# Patient Record
Sex: Female | Born: 1985 | Race: Black or African American | Hispanic: No | Marital: Single | Smoking: Current every day smoker
Health system: Southern US, Community
[De-identification: ages and names within clinical notes are randomized; demographics above are authoritative.]

## PROBLEM LIST (undated history)

## (undated) ENCOUNTER — Emergency Department

## (undated) DIAGNOSIS — O99330 Smoking (tobacco) complicating pregnancy, unspecified trimester: Secondary | ICD-10-CM

## (undated) DIAGNOSIS — O99320 Drug use complicating pregnancy, unspecified trimester: Secondary | ICD-10-CM

## (undated) DIAGNOSIS — O09529 Supervision of elderly multigravida, unspecified trimester: Secondary | ICD-10-CM

## (undated) DIAGNOSIS — F1910 Other psychoactive substance abuse, uncomplicated: Secondary | ICD-10-CM

---
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
## 2004-07-15 ENCOUNTER — Emergency Department: Payer: Self-pay | Admitting: Internal Medicine

## 2004-10-13 ENCOUNTER — Emergency Department: Payer: Self-pay | Admitting: Emergency Medicine

## 2005-08-27 ENCOUNTER — Emergency Department: Payer: Self-pay | Admitting: Emergency Medicine

## 2005-10-23 ENCOUNTER — Observation Stay: Payer: Self-pay | Admitting: Obstetrics and Gynecology

## 2005-12-21 ENCOUNTER — Ambulatory Visit: Payer: Self-pay | Admitting: Family Medicine

## 2006-02-13 ENCOUNTER — Observation Stay: Payer: Self-pay | Admitting: Obstetrics and Gynecology

## 2006-02-21 ENCOUNTER — Inpatient Hospital Stay: Payer: Self-pay

## 2006-05-15 ENCOUNTER — Emergency Department: Payer: Self-pay | Admitting: Emergency Medicine

## 2006-09-05 ENCOUNTER — Emergency Department: Payer: Self-pay | Admitting: General Practice

## 2008-01-07 ENCOUNTER — Emergency Department: Payer: Self-pay | Admitting: Emergency Medicine

## 2008-03-02 ENCOUNTER — Emergency Department: Payer: Self-pay | Admitting: Emergency Medicine

## 2008-03-04 ENCOUNTER — Emergency Department: Payer: Self-pay | Admitting: Emergency Medicine

## 2008-05-15 ENCOUNTER — Observation Stay: Payer: Self-pay | Admitting: Unknown Physician Specialty

## 2008-07-19 ENCOUNTER — Ambulatory Visit: Payer: Self-pay | Admitting: Obstetrics & Gynecology

## 2008-07-19 ENCOUNTER — Inpatient Hospital Stay (HOSPITAL_COMMUNITY): Admission: AD | Admit: 2008-07-19 | Discharge: 2008-07-19 | Payer: Self-pay | Admitting: Obstetrics & Gynecology

## 2009-04-19 ENCOUNTER — Emergency Department: Payer: Self-pay | Admitting: Emergency Medicine

## 2009-06-09 ENCOUNTER — Emergency Department: Payer: Self-pay | Admitting: Internal Medicine

## 2010-03-05 ENCOUNTER — Inpatient Hospital Stay (HOSPITAL_COMMUNITY): Admission: AD | Admit: 2010-03-05 | Discharge: 2010-03-05 | Payer: Self-pay | Admitting: Obstetrics & Gynecology

## 2010-03-05 ENCOUNTER — Ambulatory Visit: Payer: Self-pay | Admitting: Gynecology

## 2010-03-08 ENCOUNTER — Emergency Department (HOSPITAL_COMMUNITY): Admission: EM | Admit: 2010-03-08 | Discharge: 2010-03-08 | Payer: Self-pay | Admitting: Family Medicine

## 2010-08-18 ENCOUNTER — Inpatient Hospital Stay: Payer: Self-pay | Admitting: Obstetrics and Gynecology

## 2010-12-13 LAB — URINALYSIS, ROUTINE W REFLEX MICROSCOPIC
Bilirubin Urine: NEGATIVE
Hgb urine dipstick: NEGATIVE
Ketones, ur: NEGATIVE mg/dL
Nitrite: NEGATIVE
Protein, ur: NEGATIVE mg/dL
Specific Gravity, Urine: 1.02 (ref 1.005–1.030)
Urobilinogen, UA: 0.2 mg/dL (ref 0.0–1.0)
pH: 6.5 (ref 5.0–8.0)

## 2010-12-13 LAB — WET PREP, GENITAL: Trich, Wet Prep: NONE SEEN

## 2010-12-13 LAB — GC/CHLAMYDIA PROBE AMP, GENITAL
Chlamydia, DNA Probe: NEGATIVE
GC Probe Amp, Genital: NEGATIVE

## 2010-12-13 LAB — POCT PREGNANCY, URINE: Preg Test, Ur: POSITIVE

## 2010-12-13 LAB — CBC
HCT: 34.6 % — ABNORMAL LOW (ref 36.0–46.0)
MCHC: 34.4 g/dL (ref 30.0–36.0)
RBC: 3.58 MIL/uL — ABNORMAL LOW (ref 3.87–5.11)

## 2010-12-13 LAB — HCG, QUANTITATIVE, PREGNANCY: hCG, Beta Chain, Quant, S: 58892 m[IU]/mL — ABNORMAL HIGH (ref <5)

## 2011-03-15 ENCOUNTER — Emergency Department: Payer: Self-pay | Admitting: Emergency Medicine

## 2011-04-23 ENCOUNTER — Emergency Department: Payer: Self-pay | Admitting: Unknown Physician Specialty

## 2011-06-27 LAB — URINE MICROSCOPIC-ADD ON

## 2011-06-27 LAB — URINALYSIS, ROUTINE W REFLEX MICROSCOPIC
Bilirubin Urine: NEGATIVE
Hgb urine dipstick: NEGATIVE
Ketones, ur: NEGATIVE
Protein, ur: NEGATIVE
Urobilinogen, UA: 0.2
pH: 7.5

## 2011-06-27 LAB — RAPID URINE DRUG SCREEN, HOSP PERFORMED: Tetrahydrocannabinol: POSITIVE — AB

## 2011-08-11 ENCOUNTER — Encounter: Payer: Self-pay | Admitting: Obstetrics and Gynecology

## 2011-08-25 ENCOUNTER — Observation Stay: Payer: Self-pay

## 2011-11-23 ENCOUNTER — Inpatient Hospital Stay: Payer: Self-pay | Admitting: Obstetrics and Gynecology

## 2011-11-23 LAB — CBC WITH DIFFERENTIAL/PLATELET
Basophil %: 0.4 %
Eosinophil %: 0.5 %
HCT: 35.7 % (ref 35.0–47.0)
HGB: 12.1 g/dL (ref 12.0–16.0)
Lymphocyte #: 3 10*3/uL (ref 1.0–3.6)
Lymphocyte %: 28.9 %
MCV: 95 fL (ref 80–100)
Monocyte %: 13.9 %
Neutrophil #: 5.7 10*3/uL (ref 1.4–6.5)
RDW: 11.8 % (ref 11.5–14.5)
WBC: 10.2 10*3/uL (ref 3.6–11.0)

## 2011-11-23 LAB — DRUG SCREEN, URINE
Barbiturates, Ur Screen: NEGATIVE (ref ?–200)
Benzodiazepine, Ur Scrn: NEGATIVE (ref ?–200)
Cocaine Metabolite,Ur ~~LOC~~: POSITIVE (ref ?–300)
Methadone, Ur Screen: NEGATIVE (ref ?–300)
Tricyclic, Ur Screen: NEGATIVE (ref ?–1000)

## 2011-11-24 LAB — HEMATOCRIT: HCT: 30.9 % — ABNORMAL LOW (ref 35.0–47.0)

## 2012-07-11 ENCOUNTER — Emergency Department: Payer: Self-pay | Admitting: Emergency Medicine

## 2012-07-11 LAB — CBC
HCT: 40.8 % (ref 35.0–47.0)
MCH: 31.8 pg (ref 26.0–34.0)
MCHC: 34.5 g/dL (ref 32.0–36.0)
MCV: 92 fL (ref 80–100)

## 2012-07-11 LAB — WET PREP, GENITAL

## 2012-07-11 LAB — HCG, QUANTITATIVE, PREGNANCY: Beta Hcg, Quant.: 26 m[IU]/mL — ABNORMAL HIGH

## 2013-01-26 ENCOUNTER — Emergency Department: Payer: Self-pay | Admitting: Unknown Physician Specialty

## 2013-01-26 LAB — URINALYSIS, COMPLETE
Bacteria: NONE SEEN
Bilirubin,UR: NEGATIVE
Blood: NEGATIVE
Glucose,UR: NEGATIVE mg/dL (ref 0–75)
Ph: 5 (ref 4.5–8.0)
Protein: NEGATIVE
RBC,UR: 4 /HPF (ref 0–5)
WBC UR: 12 /HPF (ref 0–5)

## 2013-01-26 LAB — COMPREHENSIVE METABOLIC PANEL
Albumin: 4.2 g/dL (ref 3.4–5.0)
Anion Gap: 7 (ref 7–16)
BUN: 11 mg/dL (ref 7–18)
Bilirubin,Total: 1.6 mg/dL — ABNORMAL HIGH (ref 0.2–1.0)
Calcium, Total: 9.2 mg/dL (ref 8.5–10.1)
Chloride: 101 mmol/L (ref 98–107)
EGFR (Non-African Amer.): >60
Osmolality: 268 (ref 275–301)
Total Protein: 8.2 g/dL (ref 6.4–8.2)

## 2013-01-26 LAB — DRUG SCREEN, URINE
Barbiturates, Ur Screen: NEGATIVE (ref ?–200)
Benzodiazepine, Ur Scrn: NEGATIVE (ref ?–200)
Cocaine Metabolite,Ur ~~LOC~~: POSITIVE (ref ?–300)
MDMA (Ecstasy)Ur Screen: NEGATIVE (ref ?–500)
Methadone, Ur Screen: NEGATIVE (ref ?–300)
Opiate, Ur Screen: NEGATIVE (ref ?–300)
Phencyclidine (PCP) Ur S: NEGATIVE (ref ?–25)
Tricyclic, Ur Screen: NEGATIVE (ref ?–1000)

## 2013-01-26 LAB — CBC
HCT: 45.2 % (ref 35.0–47.0)
MCH: 31.7 pg (ref 26.0–34.0)
RBC: 4.93 10*6/uL (ref 3.80–5.20)
RDW: 13.7 % (ref 11.5–14.5)

## 2013-01-26 LAB — HCG, QUANTITATIVE, PREGNANCY: Beta Hcg, Quant.: <1 m[IU]/mL — ABNORMAL LOW

## 2013-05-08 ENCOUNTER — Emergency Department: Payer: Self-pay | Admitting: Emergency Medicine

## 2013-05-08 LAB — COMPREHENSIVE METABOLIC PANEL
Alkaline Phosphatase: 90 U/L (ref 50–136)
Anion Gap: 7 (ref 7–16)
Calcium, Total: 9.1 mg/dL (ref 8.5–10.1)
Co2: 27 mmol/L (ref 21–32)
EGFR (African American): >60
Glucose: 80 mg/dL (ref 65–99)
Potassium: 3.8 mmol/L (ref 3.5–5.1)
SGOT(AST): 16 U/L (ref 15–37)
Sodium: 136 mmol/L (ref 136–145)

## 2013-05-08 LAB — URINALYSIS, COMPLETE
Bilirubin,UR: NEGATIVE
Ph: 5 (ref 4.5–8.0)
Specific Gravity: 1.01 (ref 1.003–1.030)

## 2013-05-08 LAB — LIPASE, BLOOD: Lipase: 101 U/L (ref 73–393)

## 2013-05-08 LAB — CBC
HCT: 41.2 % (ref 35.0–47.0)
MCHC: 34.7 g/dL (ref 32.0–36.0)
Platelet: 255 10*3/uL (ref 150–440)
RBC: 4.34 10*6/uL (ref 3.80–5.20)
RDW: 12.9 % (ref 11.5–14.5)

## 2013-05-08 LAB — PREGNANCY, URINE: Pregnancy Test, Urine: NEGATIVE m[IU]/mL

## 2014-05-21 ENCOUNTER — Emergency Department: Payer: Self-pay | Admitting: Emergency Medicine

## 2014-05-21 LAB — COMPREHENSIVE METABOLIC PANEL
ALT: 15 U/L
AST: 17 U/L (ref 15–37)
Albumin: 4.1 g/dL (ref 3.4–5.0)
Alkaline Phosphatase: 71 U/L
Anion Gap: 11 (ref 7–16)
BUN: 8 mg/dL (ref 7–18)
Bilirubin,Total: 2.1 mg/dL — ABNORMAL HIGH (ref 0.2–1.0)
CALCIUM: 8.9 mg/dL (ref 8.5–10.1)
CO2: 22 mmol/L (ref 21–32)
CREATININE: 0.79 mg/dL (ref 0.60–1.30)
Chloride: 105 mmol/L (ref 98–107)
GLUCOSE: 82 mg/dL (ref 65–99)
Osmolality: 273 (ref 275–301)
POTASSIUM: 3.5 mmol/L (ref 3.5–5.1)
SODIUM: 138 mmol/L (ref 136–145)
TOTAL PROTEIN: 7.9 g/dL (ref 6.4–8.2)

## 2014-05-21 LAB — URINALYSIS, COMPLETE
BACTERIA: NONE SEEN
BILIRUBIN, UR: NEGATIVE
BLOOD: NEGATIVE
GLUCOSE, UR: NEGATIVE mg/dL (ref 0–75)
NITRITE: NEGATIVE
Ph: 8 (ref 4.5–8.0)
Protein: 30
Specific Gravity: 1.024 (ref 1.003–1.030)
Squamous Epithelial: 11
WBC UR: 37 /HPF (ref 0–5)

## 2014-05-21 LAB — CBC WITH DIFFERENTIAL/PLATELET
BASOS ABS: 0.1 10*3/uL (ref 0.0–0.1)
BASOS PCT: 0.3 %
EOS ABS: 0 10*3/uL (ref 0.0–0.7)
Eosinophil %: 0.1 %
HCT: 42.9 % (ref 35.0–47.0)
HGB: 14.7 g/dL (ref 12.0–16.0)
LYMPHS ABS: 1.7 10*3/uL (ref 1.0–3.6)
Lymphocyte %: 10.7 %
MCH: 32.2 pg (ref 26.0–34.0)
MCHC: 34.3 g/dL (ref 32.0–36.0)
MCV: 94 fL (ref 80–100)
MONOS PCT: 6.8 %
Monocyte #: 1.1 x10 3/mm — ABNORMAL HIGH (ref 0.2–0.9)
NEUTROS PCT: 82.1 %
Neutrophil #: 12.8 10*3/uL — ABNORMAL HIGH (ref 1.4–6.5)
Platelet: 286 10*3/uL (ref 150–440)
RBC: 4.57 10*6/uL (ref 3.80–5.20)
RDW: 13.1 % (ref 11.5–14.5)
WBC: 15.6 10*3/uL — ABNORMAL HIGH (ref 3.6–11.0)

## 2014-05-21 LAB — PREGNANCY, URINE: Pregnancy Test, Urine: NEGATIVE m[IU]/mL

## 2014-05-21 LAB — WET PREP, GENITAL

## 2014-05-21 LAB — HCG, QUANTITATIVE, PREGNANCY

## 2014-05-21 LAB — TROPONIN I

## 2014-05-21 LAB — LIPASE, BLOOD: LIPASE: 139 U/L (ref 73–393)

## 2014-05-22 LAB — GC/CHLAMYDIA PROBE AMP

## 2014-09-10 ENCOUNTER — Observation Stay: Payer: Self-pay

## 2014-09-10 LAB — URINALYSIS, COMPLETE
BACTERIA: NONE SEEN
BLOOD: NEGATIVE
Bilirubin,UR: NEGATIVE
Glucose,UR: NEGATIVE mg/dL (ref 0–75)
KETONE: NEGATIVE
Leukocyte Esterase: NEGATIVE
Nitrite: NEGATIVE
PH: 7 (ref 4.5–8.0)
Protein: NEGATIVE
SPECIFIC GRAVITY: 1.016 (ref 1.003–1.030)
Squamous Epithelial: 2

## 2014-09-10 LAB — DRUG SCREEN, URINE
AMPHETAMINES, UR SCREEN: NEGATIVE (ref ?–1000)
BENZODIAZEPINE, UR SCRN: NEGATIVE (ref ?–200)
Barbiturates, Ur Screen: NEGATIVE (ref ?–200)
COCAINE METABOLITE, UR ~~LOC~~: POSITIVE (ref ?–300)
Cannabinoid 50 Ng, Ur ~~LOC~~: POSITIVE (ref ?–50)
MDMA (ECSTASY) UR SCREEN: NEGATIVE (ref ?–500)
METHADONE, UR SCREEN: NEGATIVE (ref ?–300)
Opiate, Ur Screen: NEGATIVE (ref ?–300)
Phencyclidine (PCP) Ur S: NEGATIVE (ref ?–25)
TRICYCLIC, UR SCREEN: NEGATIVE (ref ?–1000)

## 2014-09-10 LAB — GC/CHLAMYDIA PROBE AMP

## 2014-09-10 LAB — PREGNANCY, URINE: PREGNANCY TEST, URINE: POSITIVE m[IU]/mL

## 2014-09-26 NOTE — L&D Delivery Note (Signed)
Deliver Note   Date of Delivery:   04/09/2015 Primary OB:   [facility] Gestational Age/EDD: 3959w1d  Antepartum complications:  OB History    Gravida Para Term Preterm AB TAB SAB Ectopic Multiple Living   6               Delivered By:   Vena AustriaStaebler, Gottlieb Zuercher MD  Delivery Type:   TSVD Anesthesia:     Epidural  Intrapartum complications:  GBS:    Unknown Laceration:    None Episiotomy:    none Placenta:    Spontaneous Estimated Blood Loss:  250mL Baby:    Liveborn female , APGAR (1 MIN):  9 APGAR (5 MINS):  9 APGAR (10 MINS):  , weight   pending   Deliver Details   A liveborn female was delivered via  NVD (Presentation: OA ).  APGAR:9 ,9 weight pending Placenta status:intact, 3VC  Cord:  withoutcomplications: Marland Kitchen.    Mom to postpartum.  Baby to Couplet care / Skin to Skin.

## 2015-01-28 ENCOUNTER — Observation Stay
Admission: RE | Admit: 2015-01-28 | Discharge: 2015-01-29 | Disposition: A | Discharge status: Home / Self Care | Payer: Medicaid Other | Attending: Obstetrics and Gynecology | Admitting: Obstetrics and Gynecology

## 2015-01-28 ENCOUNTER — Encounter: Payer: Self-pay | Admitting: *Deleted

## 2015-01-28 DIAGNOSIS — O479 False labor, unspecified: Secondary | ICD-10-CM | POA: Diagnosis present

## 2015-01-28 DIAGNOSIS — O4700 False labor before 37 completed weeks of gestation, unspecified trimester: Secondary | ICD-10-CM | POA: Diagnosis present

## 2015-01-28 DIAGNOSIS — Z3A29 29 weeks gestation of pregnancy: Secondary | ICD-10-CM | POA: Insufficient documentation

## 2015-01-28 DIAGNOSIS — O4703 False labor before 37 completed weeks of gestation, third trimester: Secondary | ICD-10-CM

## 2015-01-28 DIAGNOSIS — O99333 Smoking (tobacco) complicating pregnancy, third trimester: Secondary | ICD-10-CM | POA: Insufficient documentation

## 2015-01-28 LAB — URINALYSIS COMPLETE WITH MICROSCOPIC (ARMC ONLY)
Bilirubin Urine: NEGATIVE
GLUCOSE, UA: NEGATIVE mg/dL
KETONES UR: NEGATIVE mg/dL
LEUKOCYTES UA: NEGATIVE
Nitrite: NEGATIVE
Protein, ur: NEGATIVE mg/dL
RBC / HPF: NONE SEEN RBC/hpf (ref 0–5)
Specific Gravity, Urine: 1 — ABNORMAL LOW (ref 1.005–1.030)
pH: 7 (ref 5.0–8.0)

## 2015-01-28 MED ORDER — ACETAMINOPHEN 325 MG PO TABS: 650.0000 mg | ORAL_TABLET | ORAL | Status: DC | PRN

## 2015-01-28 NOTE — OB Triage Note (Signed)
Patient presents to L&D via EMS c/o abd pain that has lasted all day.  Patient c/o pressure and needs to go to the bathroom assisted.  Denies bleeding or SROM.  Pt also denies alchohol or drug abuse.  She continues to use tobacco and smokes 3 - 4 cigs per day.  [facility] d/t previous drug and alcohol abuse.

## 2015-01-28 NOTE — H&P (Signed)
Obstetric History and Physical  Paige Pierce is a 29 y.o. G 8 P5025 with EDD of 04/10/15 [facility] wk [facility]. Pt presents at 4466w4d with c/o contractions Patient states she has been having  Contractions every 10 minutes prior to arrival. , none vaginal bleeding, intact membranes, with active fetal movement.    Prenatal Course Source of Care: [facility] with onset of care at 17 weeks Pregnancy complications or risks: Patient Active Problem List   Diagnosis Date Noted  . Premature uterine contractions 01/28/2015  . Preterm contractions 01/28/2015    Prenatal labs and studies: ABO, Rh: AB + Antibody: Anti-A1 Rubella: Non-immune Varicella:  RPR:   HBsAg:   HIV:  GC/CT:  GBS: 1 hr Glucola   Genetic screening:   Prenatal Transfer Tool   PMHx: none    PSHx: none    OB Hx: SVD x 5  History   Social History  . Marital Status: Single    Spouse Name: N/A  . Number of Children: N/A  . Years of Education: N/A   Social History Main Topics  . Smoking status: 3-4 cigarettes/day  . Smokeless tobacco: Not on file  . Alcohol Use: D/c in pregnancy  . Drug Use: Reports d/c cocaine and marijuana use in pregnancy (previously + at visit on 09/10/14)   . Sexual Activity: Not on file                No family history on file.  Prescriptions prior to admission  Medication Sig Dispense Refill Last Dose  . DOCOSAHEXAENOIC ACID PO Take by mouth.     . promethazine (PHENERGAN) 25 MG tablet Take 25 mg by mouth every 6 (six) hours as needed for nausea or vomiting.       No Known Allergies  Review of Systems: Negative except for what is mentioned in HPI.  Physical Exam: BP 130/84 mmHg  Pulse 78  Resp 20  Ht 5\' 4"  (1.626 m)  Wt 62.143 kg (137 lb)  BMI 23.50 kg/m2 GENERAL: Well-developed, well-nourished female in no acute distress.  LUNGS: Clear to auscultation bilaterally.  HEART: Regular rate and rhythm. ABDOMEN: Soft, nontender, nondistended, gravid. EXTREMITIES: Nontender, no  edema Cervical Exam: Dilatation 0 cm   Effacement 0%   Station OOP   Presentation: cephalic FHT: Category: Baseline rate 140 bpm   Variability moderate  Accelerations present   Decelerations none Contractions: absent   Pertinent Labs/Studies:   No results found for this or any previous visit (from the past 24 hour(s)).  Assessment : IUP at 6266w4d with c/o contractions, no evidence of Preterm labor  Plan: UA and UDS Will plan for discharge home once UA returned.  Pt plans to transfer care to [facility].

## 2015-01-29 LAB — URINE DRUG SCREEN, QUALITATIVE (ARMC ONLY)
Amphetamines, Ur Screen: NOT DETECTED
Barbiturates, Ur Screen: NOT DETECTED
Benzodiazepine, Ur Scrn: NOT DETECTED
CANNABINOID 50 NG, UR ~~LOC~~: POSITIVE — AB
Cocaine Metabolite,Ur ~~LOC~~: NOT DETECTED
MDMA (ECSTASY) UR SCREEN: NOT DETECTED
Methadone Scn, Ur: NOT DETECTED
Opiate, Ur Screen: NOT DETECTED
Phencyclidine (PCP) Ur S: NOT DETECTED
Tricyclic, Ur Screen: NOT DETECTED

## 2015-01-29 NOTE — OB Triage Note (Signed)
D/c instructions given,will call with ua results.

## 2015-01-29 NOTE — Progress Notes (Signed)
Initial UDS order cancelled automatically.  New order placed as required and instructed by lab.

## 2015-02-03 NOTE — H&P (Signed)
L&D Evaluation:  History:   HPI 29 year old [id number] presents to L&D by EMS with c/o contractions/onset of labor. Pt had PNC that started at [facility] (unknown gestation at that time) then pt was incarcerated in December for violation of her probation and [facility]. Pt unsure of LMP but had U/S by [facility] on 08/11/11 that estimated pt was 23 weeks and gave EDD 12/07/11.  38 weeks today per 23 week U/S.  Pt was brought in by EMS who picked her up at a motel in the area (pt states she was visiting the [facility]). Pregnancy so far notable for drug use (cocaine and MJ) which she states she last used 2 weeks ago, smoker, incarceration during pregnancy, and depression which she uses Trazadone. Pt is learning disabled and has 1st to 2nd grade level of education. History of 4 SVD's in 05, 07, 09, and 2011 with no complications (had No PNC). The pt has no custody of any of her children. Labs: AB Pos, RI, VI, HIV Neg, Hep B Neg, GC/Chlam Negative 07/21/11 GBS unknown    Presents with contractions    Patient's Medical History depression, learning disabled    Patient's Surgical History none    Medications Pre Natal Vitamins  Tylenol (Acetaminophen)  Trazadone    Allergies NKDA    Social History tobacco  EtOH  drugs    Family History Non-Contributory   ROS:   ROS All systems were reviewed.  HEENT, CNS, GI, GU, Respiratory, CV, Renal and Musculoskeletal systems were found to be normal.   Exam:   Vital Signs stable    Urine Protein not completed    General no apparent distress, painful ctx's    Mental Status clear    Chest clear    Abdomen gravid, tender with contractions    Estimated Fetal Weight Small for gestational age    Back no CVAT    Edema no edema    Pelvic no external lesions, 5 cm on arrival, 7 cm now    Mebranes Intact    FHT normal rate with no decels    Ucx regular    Skin dry   Impression:    Impression active labor   Plan:   Plan EFM/NST, antibiotics for GBBS prophylaxis, antibiotics for GBS unknown, IV pain meds, epidural, UDS   Electronic Signatures: Shella Maximutnam, Saydi Kobel (CNM)  (Signed 27-Feb-13 12:28)  Authored: L&D Evaluation   Last Updated: 27-Feb-13 12:28 by Shella MaximPutnam, Jadie Comas (CNM)

## 2015-02-03 NOTE — H&P (Signed)
L&D Evaluation:  History Expanded:  HPI 29 yo G 8 P5025 presents with unknown gestational age with c/o abdominal pain since yesterday morning. No PNC so far this pregnancy. Reports taking 4 positive pregnancy tests about 3 months ago. 5 prior SVD's with no complications per pt. Previously treated for + Gonorrhea and Chlamydia in August. Pregnancy test at that time was negative.   Presents with abdominal pain   Patient's Medical History No Chronic Illness   Patient's Surgical History none   Medications none   Allergies NKDA   Social History tobacco  EtOH  drugs  d/c tobacco, 1 beer every few days, cocaine a few times a month, marijuana daily   Exam:  Vital Signs not done   General no apparent distress   Mental Status clear   Abdomen moderately distended, unable to palpate fundus   FHT unable to obtain FHT's with dopplar   Other US: Single live IUP at 395w5d, EDD of 04/10/15. No SCH seen. UDS: + canabis & cocaine UA: unremarkable Urine GC/Chlamydia negative   Impression:  Impression IUP at 9wks, constipation and gas   Plan:  Comments PNC: Strongly Urged pt to obtain prenatal care asap Nausea: Rx for Phenergan prn Constipation and gas: Rx for Colace and recommended increased fiber and water Substance abuse: reviewed risks with drug use in pregnancy, specifically risk for abruption with Cocaine use. Strongly urged to d/c all substance use immediately   Electronic Signatures: Bernese Doffing, Marta Lamasamara K (CNM)  (Signed 16-Dec-15 22:46)  Authored: L&D Evaluation   Last Updated: 16-Dec-15 22:46 by Vella KohlerBrothers, Amiri Riechers K (CNM)

## 2015-04-08 ENCOUNTER — Inpatient Hospital Stay: Payer: Medicaid Other | Admitting: Registered Nurse

## 2015-04-08 ENCOUNTER — Inpatient Hospital Stay
Admission: EM | Admit: 2015-04-08 | Discharge: 2015-04-11 | DRG: 775 | Disposition: A | Discharge status: Home / Self Care | Payer: Medicaid Other | Attending: Obstetrics and Gynecology | Admitting: Obstetrics and Gynecology

## 2015-04-08 ENCOUNTER — Encounter: Payer: Self-pay | Admitting: *Deleted

## 2015-04-08 DIAGNOSIS — F17210 Nicotine dependence, cigarettes, uncomplicated: Secondary | ICD-10-CM | POA: Diagnosis present

## 2015-04-08 DIAGNOSIS — Z79899 Other long term (current) drug therapy: Secondary | ICD-10-CM

## 2015-04-08 DIAGNOSIS — O99334 Smoking (tobacco) complicating childbirth: Secondary | ICD-10-CM | POA: Diagnosis present

## 2015-04-08 DIAGNOSIS — O0933 Supervision of pregnancy with insufficient antenatal care, third trimester: Secondary | ICD-10-CM

## 2015-04-08 DIAGNOSIS — Z3A40 40 weeks gestation of pregnancy: Secondary | ICD-10-CM | POA: Diagnosis present

## 2015-04-08 DIAGNOSIS — O99324 Drug use complicating childbirth: Principal | ICD-10-CM | POA: Diagnosis present

## 2015-04-08 DIAGNOSIS — F1410 Cocaine abuse, uncomplicated: Secondary | ICD-10-CM | POA: Diagnosis present

## 2015-04-08 LAB — COMPREHENSIVE METABOLIC PANEL
ALBUMIN: 2.7 g/dL — AB (ref 3.5–5.0)
ALT: 13 U/L — ABNORMAL LOW (ref 14–54)
AST: 20 U/L (ref 15–41)
Alkaline Phosphatase: 134 U/L — ABNORMAL HIGH (ref 38–126)
Anion gap: 8 (ref 5–15)
BUN: <5 mg/dL — ABNORMAL LOW (ref 6–20)
CALCIUM: 8 mg/dL — AB (ref 8.9–10.3)
CO2: 22 mmol/L (ref 22–32)
CREATININE: 1.38 mg/dL — AB (ref 0.44–1.00)
Chloride: 104 mmol/L (ref 101–111)
GFR calc Af Amer: 58 mL/min — ABNORMAL LOW (ref >60)
GFR calc non Af Amer: 50 mL/min — ABNORMAL LOW (ref >60)
Glucose, Bld: 81 mg/dL (ref 65–99)
Potassium: 3.3 mmol/L — ABNORMAL LOW (ref 3.5–5.1)
Sodium: 134 mmol/L — ABNORMAL LOW (ref 135–145)
Total Bilirubin: 0.7 mg/dL (ref 0.3–1.2)
Total Protein: 5.8 g/dL — ABNORMAL LOW (ref 6.5–8.1)

## 2015-04-08 LAB — CBC
HEMATOCRIT: 32.6 % — AB (ref 35.0–47.0)
HEMOGLOBIN: 11.1 g/dL — AB (ref 12.0–16.0)
MCH: 31.8 pg (ref 26.0–34.0)
MCHC: 34.1 g/dL (ref 32.0–36.0)
MCV: 93.2 fL (ref 80.0–100.0)
Platelets: 197 10*3/uL (ref 150–440)
RBC: 3.5 MIL/uL — ABNORMAL LOW (ref 3.80–5.20)
RDW: 13.9 % (ref 11.5–14.5)
WBC: 11.7 10*3/uL — ABNORMAL HIGH (ref 3.6–11.0)

## 2015-04-08 LAB — CHLAMYDIA/NGC RT PCR (ARMC ONLY)
Chlamydia Tr: NOT DETECTED
N GONORRHOEAE: NOT DETECTED

## 2015-04-08 LAB — URINE DRUG SCREEN, QUALITATIVE (ARMC ONLY)
Amphetamines, Ur Screen: NOT DETECTED
BENZODIAZEPINE, UR SCRN: NOT DETECTED
Barbiturates, Ur Screen: NOT DETECTED
CANNABINOID 50 NG, UR ~~LOC~~: POSITIVE — AB
Cocaine Metabolite,Ur ~~LOC~~: POSITIVE — AB
MDMA (Ecstasy)Ur Screen: NOT DETECTED
Methadone Scn, Ur: NOT DETECTED
OPIATE, UR SCREEN: NOT DETECTED
Phencyclidine (PCP) Ur S: NOT DETECTED
Tricyclic, Ur Screen: NOT DETECTED

## 2015-04-08 LAB — ABO/RH: ABO/RH(D): AB POS

## 2015-04-08 LAB — PREPARE RBC (CROSSMATCH)

## 2015-04-08 LAB — PROTEIN / CREATININE RATIO, URINE
CREATININE, URINE: 195 mg/dL
Protein Creatinine Ratio: 0.05 mg/mg{Cre} (ref 0.00–0.15)
Total Protein, Urine: 10 mg/dL

## 2015-04-08 MED ORDER — OXYTOCIN BOLUS FROM INFUSION
500.0000 mL | INTRAVENOUS | Status: DC
  Administered 2015-04-09: 500 mL via INTRAVENOUS

## 2015-04-08 MED ORDER — ONDANSETRON HCL 4 MG/2ML IJ SOLN: 4.0000 mg | Freq: Four times a day (QID) | INTRAMUSCULAR | Status: DC | PRN

## 2015-04-08 MED ORDER — SODIUM CHLORIDE 0.9 % IV SOLN
2.0000 g | Freq: Once | INTRAVENOUS | Status: AC
  Administered 2015-04-08: 2 g via INTRAVENOUS

## 2015-04-08 MED ORDER — SODIUM CHLORIDE 0.9 % IV SOLN
1.0000 g | INTRAVENOUS | Status: DC
  Administered 2015-04-08: 1 g via INTRAVENOUS
  Filled 2015-04-08 (×3): qty 1000

## 2015-04-08 MED ORDER — SODIUM CHLORIDE 0.9 % IV SOLN
INTRAVENOUS | Status: AC
  Administered 2015-04-08: 2 g via INTRAVENOUS
  Filled 2015-04-08: qty 2000

## 2015-04-08 MED ORDER — LACTATED RINGERS IV SOLN: 500.0000 mL | INTRAVENOUS | Status: DC | PRN

## 2015-04-08 MED ORDER — AMMONIA AROMATIC IN INHA
RESPIRATORY_TRACT | Status: AC
  Filled 2015-04-08: qty 10

## 2015-04-08 MED ORDER — SODIUM CHLORIDE 0.9 % IJ SOLN
INTRAMUSCULAR | Status: AC
  Filled 2015-04-08: qty 50

## 2015-04-08 MED ORDER — LIDOCAINE HCL (PF) 1 % IJ SOLN
30.0000 mL | INTRAMUSCULAR | Status: DC | PRN
  Filled 2015-04-08: qty 30

## 2015-04-08 MED ORDER — OXYTOCIN 40 UNITS IN LACTATED RINGERS INFUSION - SIMPLE MED
62.5000 mL/h | INTRAVENOUS | Status: DC
  Administered 2015-04-09: 62.5 mL/h via INTRAVENOUS
  Filled 2015-04-08: qty 1000

## 2015-04-08 MED ORDER — FENTANYL 2.5 MCG/ML W/ROPIVACAINE 0.2% IN NS 100 ML EPIDURAL INFUSION (ARMC-ANES)
EPIDURAL | Status: AC
  Administered 2015-04-08: 10 mL/h via EPIDURAL
  Filled 2015-04-08: qty 100

## 2015-04-08 MED ORDER — LIDOCAINE-EPINEPHRINE (PF) 1.5 %-1:200000 IJ SOLN
INTRAMUSCULAR | Status: AC | PRN
Start: 2015-04-08 — End: ?
  Administered 2015-04-08: 3 mL via EPIDURAL

## 2015-04-08 MED ORDER — OXYTOCIN 10 UNIT/ML IJ SOLN
INTRAMUSCULAR | Status: AC
  Filled 2015-04-08: qty 2

## 2015-04-08 MED ORDER — LACTATED RINGERS IV SOLN
INTRAVENOUS | Status: DC
  Administered 2015-04-08 (×2): via INTRAVENOUS

## 2015-04-08 MED ORDER — LIDOCAINE HCL (PF) 1 % IJ SOLN
INTRAMUSCULAR | Status: AC
  Filled 2015-04-08: qty 30

## 2015-04-08 MED ORDER — MISOPROSTOL 200 MCG PO TABS
ORAL_TABLET | ORAL | Status: AC
  Filled 2015-04-08: qty 4

## 2015-04-08 MED ORDER — ACETAMINOPHEN 325 MG PO TABS
650.0000 mg | ORAL_TABLET | ORAL | Status: DC | PRN
Start: 2015-04-08 — End: 2015-04-10
  Administered 2015-04-09 (×2): 650 mg via ORAL
  Filled 2015-04-08 (×2): qty 2

## 2015-04-08 MED ORDER — CITRIC ACID-SODIUM CITRATE 334-500 MG/5ML PO SOLN: 30.0000 mL | ORAL | Status: DC | PRN

## 2015-04-08 MED ORDER — BUPIVACAINE HCL (PF) 0.25 % IJ SOLN
INTRAMUSCULAR | Status: AC | PRN
  Administered 2015-04-08 (×2): 4 mL

## 2015-04-08 NOTE — Anesthesia Procedure Notes (Signed)
Epidural Patient location during procedure: OB Start time: 04/08/2015 4:51 PM  Staffing Anesthesiologist: Naomie DeanKEPHART, WILLIAM K Resident/CRNA: Stormy FabianURTIS, Giani Betzold  Preanesthetic Checklist Completed: patient identified, site marked, surgical consent, pre-op evaluation, timeout performed, IV checked, risks and benefits discussed and monitors and equipment checked  Epidural Patient position: sitting Prep: Betadine Patient monitoring: heart rate, continuous pulse ox and blood pressure Approach: midline Location: L4-L5 Injection technique: LOR air and LOR saline  Needle:  Needle type: Tuohy  Needle gauge: 18 G Needle length: 9 cm and 9 Needle insertion depth: 7 cm Catheter type: closed end flexible Catheter size: 20 Guage Catheter at skin depth: 11 cm Test dose: negative and 1.5% lidocaine with Epi 1:200 K  Assessment Sensory level: T10 Events: blood not aspirated, injection not painful, no injection resistance, negative IV test and no paresthesia  Additional Notes Pt's history reviewed and consent obtained as per OB consent Patient tolerated the insertion well without complications. Negative SATD, negative IVTD All VSS were obtained and monitored through OBIX and nursing protocols followed.Reason for block:procedure for pain

## 2015-04-08 NOTE — Anesthesia Preprocedure Evaluation (Signed)
Anesthesia Evaluation  Patient identified by MRN, date of birth, ID band Patient awake    Reviewed: Allergy & Precautions, H&P , NPO status , Patient's Chart, lab work & pertinent test results  History of Anesthesia Complications Negative for: history of anesthetic complications  Airway Mallampati: II  TM Distance: >3 FB Neck ROM: full    Dental  (+) Poor Dentition, Missing Missing upper:   Pulmonary Current Smoker,    Pulmonary exam normal       Cardiovascular negative cardio ROS Normal cardiovascular exam    Neuro/Psych negative neurological ROS  negative psych ROS   GI/Hepatic negative GI ROS, Neg liver ROS,   Endo/Other  negative endocrine ROS  Renal/GU negative Renal ROS  negative genitourinary   Musculoskeletal   Abdominal   Peds  Hematology negative hematology ROS (+)   Anesthesia Other Findings Right sided scoliosis  Reproductive/Obstetrics (+) Pregnancy                             Anesthesia Physical Anesthesia Plan  ASA: II  Anesthesia Plan: Epidural   Post-op Pain Management:    Induction:   Airway Management Planned:   Additional Equipment:   Intra-op Plan:   Post-operative Plan:   Informed Consent: I have reviewed the patients History and Physical, chart, labs and discussed the procedure including the risks, benefits and alternatives for the proposed anesthesia with the patient or authorized representative who has indicated his/her understanding and acceptance.     Plan Discussed with: Anesthesiologist  Anesthesia Plan Comments:         Anesthesia Quick Evaluation

## 2015-04-08 NOTE — H&P (Signed)
Obstetric H&P   Chief Complaint: Contractions  Prenatal Care Provider: [facility]  History of Present Illness: 29 y.o. [id number] at 3519w0d by 7172w2d US derived EDD of 04/08/2015 presenting with contractions.  No LOF,no VB,  +FM.  Prenatal care limited, [facility], [facility] but [facility]  AB pos / ABSC Anti-A1 / Rubella Equivocal / RPR NR / HIV neg / HBsAg neg / HepC neg / 1-hr no on record / LSIL pap / GBS unknown  Review of Systems: 10 point review of systems negative unless otherwise noted in HPI  Past Medical History: No past medical history on file.  Past Surgical History: No past surgical history on file.  Past Gynecologic History: LSIL pap this pregnancy at [facility]. History of gonorrhea and chlamydia  Family History: No family history on file.  Social History: History   Social History  . Marital Status: Single    Spouse Name: N/A  . Number of Children: N/A  . Years of Education: N/A   Occupational History  . Not on file.   Social History Main Topics  . Smoking status: Current Every Day Smoker -- 0.25 packs/day    Types: Cigarettes  . Smokeless tobacco: Not on file  . Alcohol Use: Not on file  . Drug Use: Not on file  . Sexual Activity: Not on file   Other Topics Concern  . Not on file   Social History Narrative  . No narrative on file    Medications: Prior to Admission medications   Medication Sig Start Date End Date Taking? Authorizing Provider  DOCOSAHEXAENOIC ACID PO Take by mouth. 10/21/14 10/21/15  Historical Provider, MD  promethazine (PHENERGAN) 25 MG tablet Take 25 mg by mouth every 6 (six) hours as needed for nausea or vomiting.    Historical Provider, MD    Allergies: No Known Allergies  Physical Exam: There were no vitals filed for this visit.  Urine Dip Protein: Pending   FHT: 120, moderate, positive accels, no decels Toco: q354min  General: NAD, contracting painfully HEENT: normocephalic, anicteric Pulmonary:  CTAB Cardiovascular: RRR Abdomen: Gravid, non-tender Leopolds: vtx 6.5lbs Genitourinary: 4/C/0 Extremities: no edema  Labs: No results found for this or any previous visit (from the past 24 hour(s)).  Assessment: 29 y.o. [id number] at 3019w0d by 8972w2d US at [facility], limited prenatal care, history of substance abuse  Plan: 1) Labor - admit for term labor  2) Fetus - category I tracing, reactive NST  3) PNL AB pos / ABSC Anti-A1 / Rubella Equivocal / RPR NR / HIV neg / HBsAg neg / HepC neg / 1-hr no on record / LSIL pap / GBS unknown  4) TDAP - no records of having received this pregnancy  5) Substance abuse - urine drug screen ordered - Resident at [facility] for cocaine use  6) GBS unknown - no antibiotic prophylaxis indicated based on 2010 CDC "Guidlines for the Prevention of Perinatal Group B Streptococcal Disease" risk based screening approach for GBS unknown.   6) Anti-A1 antibody - type and screen on admission, given grand multiparity high risk of postpartum hemorrhage and given antibody will go ahead and crossmatch 2 units  7)  Elevated BP - mild range blood pressures, UDS / CMP/ CBC / P/C ratio  - [facility] 146/79 normal LFT's, Hgb 10.2, platelets 229K, P?C ratio 0.075  8) Disposition - pending delivery

## 2015-04-08 NOTE — Progress Notes (Signed)
   04/08/15 1820  Clinical Encounter Type  Visited With Patient and family together  Visit Type Initial;Spiritual support  Referral From Nurse  Consult/Referral To Chaplain  Spiritual Encounters  Spiritual Needs Prayer  Stress Factors  Patient Stress Factors Other (Comment)  Chaplain received order and went to visit patient. Offered prayer and compassionate presence. Patient would like a follow up viist once baby is delivered. Chaplain Levora Werden A. Yoel Kaufhold Ext. [phone]

## 2015-04-08 NOTE — Progress Notes (Signed)
Subjective:  Feeling more pressure  Objective:   Vitals: Blood pressure 143/72, pulse 101, temperature 97.9 F (36.6 C), temperature source Oral, resp. rate 20, height  (1.626 m), weight 67.132 kg (148 lb), SpO2 100 %. Cervical Exam:  Dilation: Lip/rim Effacement (%): 100 Cervical Position: Anterior Station: -1 Presentation: Vertex Exam by:: AS MD  FHT: 120, moderate, positive accels, occasional early Toco: q3-82min  Results for orders placed or performed during the hospital encounter of 04/08/15 (from the past 24 hour(s))  CBC     Status: Abnormal   Collection Time: 04/08/15  3:25 PM  Result Value Ref Range   WBC 11.7 (H) 3.6 - 11.0 K/uL   RBC 3.50 (L) 3.80 - 5.20 MIL/uL   Hemoglobin 11.1 (L) 12.0 - 16.0 g/dL   HCT 16.1 (L) 09.6 - 04.5 %   MCV 93.2 80.0 - 100.0 fL   MCH 31.8 26.0 - 34.0 pg   MCHC 34.1 32.0 - 36.0 g/dL   RDW 40.9 81.1 - 91.4 %   Platelets 197 150 - 440 K/uL  Comprehensive metabolic panel     Status: Abnormal   Collection Time: 04/08/15  3:25 PM  Result Value Ref Range   Sodium 134 (L) 135 - 145 mmol/L   Potassium 3.3 (L) 3.5 - 5.1 mmol/L   Chloride 104 101 - 111 mmol/L   CO2 22 22 - 32 mmol/L   Glucose, Bld 81 65 - 99 mg/dL   BUN <5 (L) 6 - 20 mg/dL   Creatinine, Ser 7.82 (H) 0.44 - 1.00 mg/dL   Calcium 8.0 (L) 8.9 - 10.3 mg/dL   Total Protein 5.8 (L) 6.5 - 8.1 g/dL   Albumin 2.7 (L) 3.5 - 5.0 g/dL   AST 20 15 - 41 U/L   ALT 13 (L) 14 - 54 U/L   Alkaline Phosphatase 134 (H) 38 - 126 U/L   Total Bilirubin 0.7 0.3 - 1.2 mg/dL   GFR calc non Af Amer 50 (L) >60 mL/min   GFR calc Af Amer 58 (L) >60 mL/min   Anion gap 8 5 - 15  Protein / creatinine ratio, urine     Status: None   Collection Time: 04/08/15  3:26 PM  Result Value Ref Range   Creatinine, Urine 195 mg/dL   Total Protein, Urine 10 mg/dL   Protein Creatinine Ratio 0.05 0.00 - 0.15 mg/mg[Cre]  Urine Drug Screen, Qualitative ([facility] only)     Status: Abnormal   Collection Time: 04/08/15   3:27 PM  Result Value Ref Range   Tricyclic, Ur Screen NONE DETECTED NONE DETECTED   Amphetamines, Ur Screen NONE DETECTED NONE DETECTED   MDMA (Ecstasy)Ur Screen NONE DETECTED NONE DETECTED   Cocaine Metabolite,Ur [place] POSITIVE (A) NONE DETECTED   Opiate, Ur Screen NONE DETECTED NONE DETECTED   Phencyclidine (PCP) Ur S NONE DETECTED NONE DETECTED   Cannabinoid 50 Ng, Ur [place] POSITIVE (A) NONE DETECTED   Barbiturates, Ur Screen NONE DETECTED NONE DETECTED   Benzodiazepine, Ur Scrn NONE DETECTED NONE DETECTED   Methadone Scn, Ur NONE DETECTED NONE DETECTED  Chlamydia/NGC rt PCR ([facility] only)     Status: None   Collection Time: 04/08/15  3:27 PM  Result Value Ref Range   Specimen source GC/Chlam URINE, RANDOM    Chlamydia Tr NOT DETECTED NOT DETECTED   N gonorrhoeae NOT DETECTED NOT DETECTED  Type and screen     Status: None (Preliminary result)   Collection Time: 04/08/15  3:31 PM  Result  Value Ref Range   ABO/RH(D) AB POS    Antibody Screen NEG    Sample Expiration 04/11/2015    Unit Number [id number]    Blood Component Type RED CELLS,LR    Unit division 00    Status of Unit ALLOCATED    Transfusion Status OK TO TRANSFUSE    Crossmatch Result COMPATIBLE    Unit Number [id number]    Blood Component Type RED CELLS,LR    Unit division 00    Status of Unit ALLOCATED    Transfusion Status OK TO TRANSFUSE    Crossmatch Result COMPATIBLE   Prepare RBC     Status: None   Collection Time: 04/08/15  3:31 PM  Result Value Ref Range   Order Confirmation ORDER PROCESSED BY BLOOD BANK   ABO/Rh     Status: None   Collection Time: 04/08/15  3:32 PM  Result Value Ref Range   ABO/RH(D) AB POS     Assessment:   29 y.o. G6P0 1811w0d GHTN, cocaine positive, anti-A1 antibody  Plan:   1) Labor - AROM last check, anterior lip   2) Fetus - cat I tracing

## 2015-04-08 NOTE — Progress Notes (Signed)
Subjective:  Comfortable with epidural in place  Objective:   Vitals: Blood pressure 123/59, pulse 72, temperature 97.9 F (36.6 C), temperature source Oral, resp. rate 20, height 5\' 4"  (1.626 m), weight 67.132 kg (148 lb), SpO2 100 %. General: NAD Abdomen: gravid, soft in between contractions Cervical Exam: AROM clear Dilation: 9 Effacement (%): 100 Cervical Position: Anterior Station: -1 Presentation: Vertex Exam by:: troberts  FHT: 125, moderate, positive accels, no decels Toco: q234min  Results for orders placed or performed during the hospital encounter of 04/08/15 (from the past 24 hour(s))  CBC     Status: Abnormal   Collection Time: 04/08/15  3:25 PM  Result Value Ref Range   WBC 11.7 (H) 3.6 - 11.0 K/uL   RBC 3.50 (L) 3.80 - 5.20 MIL/uL   Hemoglobin 11.1 (L) 12.0 - 16.0 g/dL   HCT 40.932.6 (L) 81.135.0 - 91.447.0 %   MCV 93.2 80.0 - 100.0 fL   MCH 31.8 26.0 - 34.0 pg   MCHC 34.1 32.0 - 36.0 g/dL   RDW 78.213.9 95.611.5 - 21.314.5 %   Platelets 197 150 - 440 K/uL  Comprehensive metabolic panel     Status: Abnormal   Collection Time: 04/08/15  3:25 PM  Result Value Ref Range   Sodium 134 (L) 135 - 145 mmol/L   Potassium 3.3 (L) 3.5 - 5.1 mmol/L   Chloride 104 101 - 111 mmol/L   CO2 22 22 - 32 mmol/L   Glucose, Bld 81 65 - 99 mg/dL   BUN <5 (L) 6 - 20 mg/dL   Creatinine, Ser 0.861.38 (H) 0.44 - 1.00 mg/dL   Calcium 8.0 (L) 8.9 - 10.3 mg/dL   Total Protein 5.8 (L) 6.5 - 8.1 g/dL   Albumin 2.7 (L) 3.5 - 5.0 g/dL   AST 20 15 - 41 U/L   ALT 13 (L) 14 - 54 U/L   Alkaline Phosphatase 134 (H) 38 - 126 U/L   Total Bilirubin 0.7 0.3 - 1.2 mg/dL   GFR calc non Af Amer 50 (L) >60 mL/min   GFR calc Af Amer 58 (L) >60 mL/min   Anion gap 8 5 - 15  Protein / creatinine ratio, urine     Status: None   Collection Time: 04/08/15  3:26 PM  Result Value Ref Range   Creatinine, Urine 195 mg/dL   Total Protein, Urine 10 mg/dL   Protein Creatinine Ratio 0.05 0.00 - 0.15 mg/mg[Cre]  Urine Drug Screen,  Qualitative ([facility] only)     Status: Abnormal   Collection Time: 04/08/15  3:27 PM  Result Value Ref Range   Tricyclic, Ur Screen NONE DETECTED NONE DETECTED   Amphetamines, Ur Screen NONE DETECTED NONE DETECTED   MDMA (Ecstasy)Ur Screen NONE DETECTED NONE DETECTED   Cocaine Metabolite,Ur [place] POSITIVE (A) NONE DETECTED   Opiate, Ur Screen NONE DETECTED NONE DETECTED   Phencyclidine (PCP) Ur S NONE DETECTED NONE DETECTED   Cannabinoid 50 Ng, Ur [place] POSITIVE (A) NONE DETECTED   Barbiturates, Ur Screen NONE DETECTED NONE DETECTED   Benzodiazepine, Ur Scrn NONE DETECTED NONE DETECTED   Methadone Scn, Ur NONE DETECTED NONE DETECTED  Chlamydia/NGC rt PCR ([facility] only)     Status: None   Collection Time: 04/08/15  3:27 PM  Result Value Ref Range   Specimen source GC/Chlam URINE, RANDOM    Chlamydia Tr NOT DETECTED NOT DETECTED   N gonorrhoeae NOT DETECTED NOT DETECTED  Type and screen     Status: None (Preliminary result)  Collection Time: 04/08/15  3:31 PM  Result Value Ref Range   ABO/RH(D) AB POS    Antibody Screen NEG    Sample Expiration 04/11/2015    Unit Number [id number]    Blood Component Type RED CELLS,LR    Unit division 00    Status of Unit ALLOCATED    Transfusion Status OK TO TRANSFUSE    Crossmatch Result COMPATIBLE    Unit Number [id number]    Blood Component Type RED CELLS,LR    Unit division 00    Status of Unit ALLOCATED    Transfusion Status OK TO TRANSFUSE    Crossmatch Result COMPATIBLE   Prepare RBC     Status: None   Collection Time: 04/08/15  3:31 PM  Result Value Ref Range   Order Confirmation ORDER PROCESSED BY BLOOD BANK   ABO/Rh     Status: None   Collection Time: 04/08/15  3:32 PM  Result Value Ref Range   ABO/RH(D) AB POS     Assessment:   29 y.o. G6P5005 at [redacted]w[redacted]d spontaneous labor, GHTN, cocaine positive, and anti-A1 antibody  Plan:   1) Labor - AROM clear, second dose of ampicillin hung (per [facility] protocol despite 2010 CDC  guidelines)  2) Fetus - cat I tracing

## 2015-04-09 DIAGNOSIS — O0933 Supervision of pregnancy with insufficient antenatal care, third trimester: Secondary | ICD-10-CM | POA: Diagnosis not present

## 2015-04-09 DIAGNOSIS — Z79899 Other long term (current) drug therapy: Secondary | ICD-10-CM | POA: Diagnosis not present

## 2015-04-09 DIAGNOSIS — F1410 Cocaine abuse, uncomplicated: Secondary | ICD-10-CM | POA: Diagnosis present

## 2015-04-09 DIAGNOSIS — F17210 Nicotine dependence, cigarettes, uncomplicated: Secondary | ICD-10-CM | POA: Diagnosis present

## 2015-04-09 DIAGNOSIS — Z3A40 40 weeks gestation of pregnancy: Secondary | ICD-10-CM | POA: Diagnosis present

## 2015-04-09 DIAGNOSIS — O99324 Drug use complicating childbirth: Secondary | ICD-10-CM | POA: Diagnosis present

## 2015-04-09 DIAGNOSIS — O99334 Smoking (tobacco) complicating childbirth: Secondary | ICD-10-CM | POA: Diagnosis present

## 2015-04-09 LAB — HIV ANTIBODY (ROUTINE TESTING W REFLEX): HIV Screen 4th Generation wRfx: NONREACTIVE

## 2015-04-09 LAB — RPR: RPR: NONREACTIVE

## 2015-04-09 MED ORDER — IBUPROFEN 600 MG PO TABS
600.0000 mg | ORAL_TABLET | Freq: Four times a day (QID) | ORAL | Status: DC
  Administered 2015-04-09 – 2015-04-11 (×9): 600 mg via ORAL
  Filled 2015-04-09 (×8): qty 1

## 2015-04-09 MED ORDER — IBUPROFEN 600 MG PO TABS
ORAL_TABLET | ORAL | Status: AC
  Administered 2015-04-09: 600 mg via ORAL
  Filled 2015-04-09: qty 1

## 2015-04-09 MED ORDER — NICOTINE 21 MG/24HR TD PT24
21.0000 mg | MEDICATED_PATCH | Freq: Every day | TRANSDERMAL | Status: DC
  Filled 2015-04-09: qty 1

## 2015-04-09 NOTE — Progress Notes (Signed)
Post Partum Day 0-10 hours postpartum Subjective: I was just about to take a nap. No complaints offered  Objective: Blood pressure 129/75, pulse 77, temperature 98 F (36.7 C), temperature source Oral, resp. rate 18, height 5\' 4"  (1.626 m), weight 148 lb (67.132 kg), SpO2 100 %, unknown if currently breastfeeding.  Physical Exam:  General: alert, quiet, sleepy Lochia: appropriate Uterine Fundus: firm/ at U/ just left of ML DVT Evaluation: No evidence of DVT seen on physical exam.   Recent Labs  04/08/15 1525  HGB 11.1*  HCT 32.6*  WBC 11.7*  PLT 197    Assessment/Plan: Stable 10 hrs postpartum-continue postpartum care Cocaine abuse  Bottlefeeding   LOS: 0 days   Nikky Duba 04/09/2015, 11:42 AM

## 2015-04-09 NOTE — Discharge Summary (Signed)
Obstetric Discharge Summary Reason for Admission: onset of labor Prenatal Procedures: NST Intrapartum Procedures: spontaneous vaginal delivery Postpartum Procedures: administration of MMR Complications-Operative and Postpartum: none HEMOGLOBIN  Date Value Ref Range Status  04/08/2015 11.1* 12.0 - 16.0 g/dL Final   HGB  Date Value Ref Range Status  05/21/2014 14.7 12.0-16.0 g/dL Final   HCT  Date Value Ref Range Status  04/08/2015 32.6* 35.0 - 47.0 % Final  05/21/2014 42.9 35.0-47.0 % Final    Physical Exam:  General: alert Lochia: appropriate Uterine Fundus: firm at umbilicus Incision: n/a DVT Evaluation: No evidence of DVT seen on physical exam.  Hospital Course: Admitted for labor. Went on to have SVD. Uncomplicated post-partum course apart from some elevated (mild range) blood pressures on PPD#2.  Denies headaches, visual disturbances, and RUQ pain.    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  Will Bonnet, MD, [place] 04/11/2015 10:18 AM

## 2015-04-10 LAB — CBC
HEMATOCRIT: 30.5 % — AB (ref 35.0–47.0)
HEMOGLOBIN: 10.2 g/dL — AB (ref 12.0–16.0)
MCH: 31.8 pg (ref 26.0–34.0)
MCHC: 33.5 g/dL (ref 32.0–36.0)
MCV: 95 fL (ref 80.0–100.0)
PLATELETS: 209 10*3/uL (ref 150–440)
RBC: 3.21 MIL/uL — ABNORMAL LOW (ref 3.80–5.20)
RDW: 14.3 % (ref 11.5–14.5)
WBC: 12.2 10*3/uL — ABNORMAL HIGH (ref 3.6–11.0)

## 2015-04-10 MED ORDER — OXYCODONE-ACETAMINOPHEN 5-325 MG PO TABS
2.0000 | ORAL_TABLET | ORAL | Status: DC | PRN
Start: 2015-04-10 — End: 2015-04-11
  Administered 2015-04-10 – 2015-04-11 (×6): 2 via ORAL
  Filled 2015-04-10 (×6): qty 2

## 2015-04-10 MED ORDER — ONDANSETRON HCL 4 MG PO TABS: 4.0000 mg | ORAL_TABLET | ORAL | Status: DC | PRN

## 2015-04-10 MED ORDER — OXYCODONE-ACETAMINOPHEN 5-325 MG PO TABS: 1.0000 | ORAL_TABLET | ORAL | Status: DC | PRN

## 2015-04-10 MED ORDER — DIPHENHYDRAMINE HCL 25 MG PO CAPS: 25.0000 mg | ORAL_CAPSULE | Freq: Four times a day (QID) | ORAL | Status: DC | PRN

## 2015-04-10 MED ORDER — ONDANSETRON HCL 4 MG/2ML IJ SOLN
4.0000 mg | INTRAMUSCULAR | Status: DC | PRN
Start: 2015-04-10 — End: 2015-04-10

## 2015-04-10 MED ORDER — LANOLIN HYDROUS EX OINT: TOPICAL_OINTMENT | CUTANEOUS | Status: DC | PRN

## 2015-04-10 MED ORDER — SENNOSIDES-DOCUSATE SODIUM 8.6-50 MG PO TABS: 2.0000 | ORAL_TABLET | ORAL | Status: DC

## 2015-04-10 MED ORDER — PRENATAL MULTIVITAMIN CH
1.0000 | ORAL_TABLET | Freq: Every day | ORAL | Status: DC
  Administered 2015-04-10 – 2015-04-11 (×2): 1 via ORAL
  Filled 2015-04-10 (×2): qty 1

## 2015-04-10 MED ORDER — DIBUCAINE 1 % RE OINT: 1.0000 "application " | TOPICAL_OINTMENT | RECTAL | Status: DC | PRN

## 2015-04-10 MED ORDER — WITCH HAZEL-GLYCERIN EX PADS: 1.0000 "application " | MEDICATED_PAD | CUTANEOUS | Status: DC | PRN

## 2015-04-10 MED ORDER — SIMETHICONE 80 MG PO CHEW: 80.0000 mg | CHEWABLE_TABLET | ORAL | Status: DC | PRN

## 2015-04-10 MED ORDER — BENZOCAINE-MENTHOL 20-0.5 % EX AERO: 1.0000 "application " | INHALATION_SPRAY | CUTANEOUS | Status: DC | PRN

## 2015-04-10 MED ORDER — TETANUS-DIPHTH-ACELL PERTUSSIS 5-2.5-18.5 LF-MCG/0.5 IM SUSP: 0.5000 mL | Freq: Once | INTRAMUSCULAR | Status: DC

## 2015-04-10 MED ORDER — MEASLES, MUMPS & RUBELLA VAC ~~LOC~~ INJ
0.5000 mL | INJECTION | Freq: Once | SUBCUTANEOUS | Status: DC
  Filled 2015-04-10: qty 0.5

## 2015-04-10 MED ORDER — ACETAMINOPHEN 325 MG PO TABS: 650.0000 mg | ORAL_TABLET | ORAL | Status: DC | PRN

## 2015-04-10 NOTE — Anesthesia Post-op Follow-up Note (Signed)
  Anesthesia Pain Follow-up Note  Patient: Paige Pierce  Day #: 1  Date of Follow-up: 04/10/2015 Time: 7:26 AM  Last Vitals:  Filed Vitals:   04/09/15 2326  BP: 107/57  Pulse: 60  Temp: 36.7 C  Resp: 18    Level of Consciousness: alert  Pain: none   Side Effects:None  Catheter Site Exam:clean, dry, no drainage  Plan: D/C from anesthesia care  Chong SicilianLopez,  Pearla Mckinny

## 2015-04-10 NOTE — Progress Notes (Signed)
Daily Post Partum Note  Paige Pierce is a 29 y.o. G6P6 PPD#1 s/p  TSVD/IP   Pregnancy c/b +THC and cocaine on admission, scant PNC @ [facility]  24hr/overnight events:  none  Subjective:  Meeting all pp goals  Objective:    Current Vital Signs 24h Vital Sign Ranges  T 97.7 F (36.5 C) Temp  Avg: 98 F (36.7 C)  Min: 97.7 F (36.5 C)  Max: 98 F (36.7 C)  BP 123/72 mmHg BP  Min: 107/57  Max: 138/80  HR 68 Pulse  Avg: 71.1  Min: 60  Max: 79  RR 18 Resp  Avg: 18.9  Min: 18  Max: 20  SaO2 100 %   SpO2  Avg: 100 %  Min: 100 %  Max: 100 %       24 Hour I/O Current Shift I/O  Time Ins Outs 07/14 0701 - 07/15 0700 In: 480 [P.O.:480] Out: -       General: NAD Abdomen: NTTP, FF below the umbilicus Perineum: deferred Skin:  Warm and dry.  Cardiovascular:Regular rate and rhythm. Respiratory:  Clear to auscultation bilateral. Normal respiratory effort Extremities: no c/c/e  Medications Current Facility-Administered Medications  Medication Dose Route Frequency Provider Last Rate Last Dose  . acetaminophen (TYLENOL) tablet 650 mg  650 mg Oral Q4H PRN Malachy Mood, MD      . benzocaine-Menthol (DERMOPLAST) 20-0.5 % topical spray 1 application  1 application Topical PRN Malachy Mood, MD      . witch hazel-glycerin (TUCKS) pad 1 application  1 application Topical PRN Malachy Mood, MD       And  . dibucaine (NUPERCAINAL) 1 % rectal ointment 1 application  1 application Rectal PRN Malachy Mood, MD      . diphenhydrAMINE (BENADRYL) capsule 25 mg  25 mg Oral Q6H PRN Malachy Mood, MD      . ibuprofen (ADVIL,MOTRIN) tablet 600 mg  600 mg Oral 4 times per day Malachy Mood, MD   600 mg at 04/10/15 [phone]  . lanolin ointment   Topical PRN Malachy Mood, MD      . nicotine (NICODERM CQ - dosed in mg/24 hours) patch 21 mg  21 mg Transdermal Daily Malachy Mood, MD      . ondansetron [facility]) tablet 4 mg  4 mg Oral Q4H PRN Malachy Mood, MD      .  oxyCODONE-acetaminophen (PERCOCET/ROXICET) 5-325 MG per tablet 1 tablet  1 tablet Oral Q4H PRN Malachy Mood, MD      . oxyCODONE-acetaminophen (PERCOCET/ROXICET) 5-325 MG per tablet 2 tablet  2 tablet Oral Q4H PRN Malachy Mood, MD   2 tablet at 04/10/15 [phone]  . prenatal multivitamin tablet 1 tablet  1 tablet Oral Q1200 Malachy Mood, MD      . senna-docusate (Senokot-S) tablet 2 tablet  2 tablet Oral Q24H Malachy Mood, MD      . simethicone [facility]) chewable tablet 80 mg  80 mg Oral PRN Malachy Mood, MD      . Tdap Durwin Reges) injection 0.5 mL  0.5 mL Intramuscular Once Malachy Mood, MD       Facility-Administered Medications Ordered in Other Encounters  Medication Dose Route Frequency Provider Last Rate Last Dose  . bupivacaine (PF) (MARCAINE) 0.25 % injection    Anesthesia Intra-op Doreen Salvage, CRNA   4 mL at 04/08/15 1722  . lidocaine-EPINEPHrine 1.5 %-1:200000 injection    Anesthesia Intra-op Doreen Salvage, CRNA   3 mL at 04/08/15 1719    Labs:  Recent Labs Lab 04/08/15 1525  WBC 11.7*  HGB 11.1*  HCT 32.6*  PLT 197    Recent Labs Lab 04/08/15 1525  NA 134*  K 3.3*  CL 104  CO2 22  BUN <5*  CREATININE 1.38*  GLUCOSE 81  CALCIUM 8.0*    Radiology: none  Assessment & Plan:  Pt doing well *Postpartum/postop: routine care. MMR ordered. Bottle, OCPs *+UDS: SW consulted *dispo: likely tomorrow   Aletha Halim, Brooke Bonito. MD [facility] OBGYN Pager [phone]

## 2015-04-10 NOTE — Anesthesia Postprocedure Evaluation (Signed)
  Anesthesia Post-op Note  Patient: Paige Pierce  Procedure(s) Performed: * No procedures listed *  Anesthesia type:Epidural  Patient location: 341 a  Post pain: Pain level controlled  Post assessment: Post-op Vital signs reviewed, Patient's Cardiovascular Status Stable, Respiratory Function Stable, Patent Airway and No signs of Nausea or vomiting  Post vital signs: Reviewed and stable  Last Vitals:  Filed Vitals:   04/09/15 2326  BP: 107/57  Pulse: 60  Temp: 36.7 C  Resp: 18    Level of consciousness: awake, alert  and patient cooperative  Complications: No apparent anesthesia complications

## 2015-04-11 LAB — CULTURE, BETA STREP (GROUP B ONLY)

## 2015-04-11 MED ORDER — HYDROCODONE-ACETAMINOPHEN 5-325 MG PO TABS
1.0000 | ORAL_TABLET | Freq: Four times a day (QID) | ORAL | Status: DC | PRN
  Administered 2015-04-11 (×2): 1 via ORAL
  Filled 2015-04-11 (×2): qty 1

## 2015-04-11 MED ORDER — IBUPROFEN 600 MG PO TABS: 600.0000 mg | ORAL_TABLET | Freq: Four times a day (QID) | ORAL | Status: DC

## 2015-04-11 MED ORDER — HYDROCODONE-ACETAMINOPHEN 5-325 MG PO TABS: 1.0000 | ORAL_TABLET | Freq: Four times a day (QID) | ORAL | Status: DC | PRN

## 2015-04-11 MED ORDER — NORETHINDRONE 0.35 MG PO TABS: 1.0000 | ORAL_TABLET | Freq: Every day | ORAL | Status: DC

## 2015-04-11 NOTE — Progress Notes (Signed)
`                                                                                                                                                                                                                                                                                                                                                                                                                                                                                                                                                                                                                                                                                                                                                                                                                                                                                                                                                                                                                                                                                                                                                                                                                                                                                                                                                                                                                                                                                                                                                                                                                                                                                                                      

## 2015-04-11 NOTE — Clinical Social Work Maternal (Signed)
  CLINICAL SOCIAL WORK MATERNAL/CHILD NOTE  Patient Details  Name: Paige Pierce MRN: [id number] Date of Birth: [birth year]  Date:  04/11/2015  Clinical Social Worker Initiating Note:  Toma Copier, [place] Date/ Time Initiated:  04/11/15/1000     Child's Name:  Paige Pierce   Legal Guardian:  Mother   Need for Interpreter:  None   Date of Referral:  04/10/15     Reason for Referral:  Current Substance Use/Substance Use During Pregnancy    Referral Source:  Physician   Address:   [place] [address]  Phone number:      Household Members:  Self, Minor Children, Relatives (cousin Eustaquio Maize)  Natural Supports (not living in the home):  Extended Family Mother is [name] [phone]   Professional Supports: Case Manager/Social Worker   Employment: Disabled   Type of Work:     Education:  9 to 11 years   Pensions consultant:  Community education officer, [employer]   Other Resources:  Physicist, medical , [place] Considerations Which May Impact Care:  None  Strengths:  Home prepared for child , Ability to meet basic needs    Risk Factors/Current Problems:  Substance Use    Cognitive State:  Alert    Mood/Affect:  Anxious , Happy    CSW Assessment: Clinical Social Worker was referred to Pt due to positive urine drug screen for [facility] and cocaine. Pt also in need of a car seat per maternity care coordinator from [facility].  CSW met with Pt while newborn is in the room. Pt attentive to child's needs during assessment.  CSW discussed with Pt's current resources and plans at discharge. Pt plans to go to her cousin [name]'s house at discharge. Pt reports that her maternity care coordinator is going to meet her on Monday to take her to [facility] for an evaluation due to her substance use. Pt reports cocaine and THC use at 1-2x's a month with last use 2 weeks ago. CSW shared that she was positive for cocaine on admission which  would not be in her urine unless there was more recent use. Pt reports periods of sobriety and that her heaviest use was a year ago.  Pt states that her son is what is motivating her now to get clean. Pt reports changing her home is part of the plan to "get clean."  CSW offered support and notified Pt that a CPS report would be made due to baby also being positive. Pt reports hx with CPS several years ago.  CSW filed report on 03/11/15.  Pt will likely discharge today. CSW provided car seat.   CSW Plan/Description:  Child Protective Service Report , Information/Referral to [employer]      **Of note, Pt reads and writes at an [employer] level. She prefers information to be explained versus written and given to her for review.    Alonna Buckler, LCSW 04/11/2015, 3:24 PM

## 2015-04-12 LAB — TYPE AND SCREEN
ABO/RH(D): AB POS
Antibody Screen: NEGATIVE
UNIT DIVISION: 0
Unit division: 0

## 2016-09-26 NOTE — L&D Delivery Note (Signed)
Delivery Note At 7:03 PM a viable female was delivered via Vaginal, Spontaneous Delivery (Presentation: OA).  APGAR: 5, 7; weight 3,330 grams .   Placenta status: delivered spontaneously, intact. Cord: 3VC with the following complications: none.  Cord pH: n/a  Anesthesia:  None Episiotomy: None Lacerations: None Suture Repair: n/a Est. Blood Loss (mL): 300  Mom to postpartum.  Baby to Couplet care / Skin to Skin.  Called to see patient.  Mom pushed to deliver a viable female infant.  The head followed by shoulders, which delivered without difficulty, and the rest of the body.  A single tight nuchal cord noted and delivered through.  Reduced at perineum.  Cord clamped and cut.  Cord blood obtained with unknown blood type.  Placenta delivered spontaneously, intact, with a 3-vessel cord.  No vaginal, cervical, or perineal lacerations. All counts correct.  Hemostasis obtained with IM pitocin and fundal massage. EBL 300 mL.     Conard NovakJackson, Karsten Howry D, MD 09/30/2016, 7:19 PM

## 2016-09-30 ENCOUNTER — Encounter: Payer: Self-pay | Admitting: Obstetrics and Gynecology

## 2016-09-30 ENCOUNTER — Inpatient Hospital Stay
Admission: EM | Admit: 2016-09-30 | Discharge: 2016-10-02 | DRG: 775 | Disposition: A | Discharge status: Home / Self Care | Payer: Medicaid Other | Attending: Obstetrics and Gynecology | Admitting: Obstetrics and Gynecology

## 2016-09-30 DIAGNOSIS — O99324 Drug use complicating childbirth: Secondary | ICD-10-CM | POA: Diagnosis present

## 2016-09-30 DIAGNOSIS — F1490 Cocaine use, unspecified, uncomplicated: Secondary | ICD-10-CM | POA: Diagnosis present

## 2016-09-30 DIAGNOSIS — F17210 Nicotine dependence, cigarettes, uncomplicated: Secondary | ICD-10-CM | POA: Diagnosis present

## 2016-09-30 DIAGNOSIS — O99334 Smoking (tobacco) complicating childbirth: Secondary | ICD-10-CM | POA: Diagnosis present

## 2016-09-30 DIAGNOSIS — Z3A00 Weeks of gestation of pregnancy not specified: Secondary | ICD-10-CM

## 2016-09-30 DIAGNOSIS — O134 Gestational [pregnancy-induced] hypertension without significant proteinuria, complicating childbirth: Secondary | ICD-10-CM | POA: Diagnosis present

## 2016-09-30 DIAGNOSIS — F1410 Cocaine abuse, uncomplicated: Secondary | ICD-10-CM | POA: Diagnosis present

## 2016-09-30 DIAGNOSIS — O0930 Supervision of pregnancy with insufficient antenatal care, unspecified trimester: Secondary | ICD-10-CM

## 2016-09-30 DIAGNOSIS — Z3493 Encounter for supervision of normal pregnancy, unspecified, third trimester: Secondary | ICD-10-CM | POA: Diagnosis present

## 2016-09-30 LAB — CBC
HCT: 35.6 % (ref 35.0–47.0)
Hemoglobin: 12.2 g/dL (ref 12.0–16.0)
MCH: 31.6 pg (ref 26.0–34.0)
MCHC: 34.3 g/dL (ref 32.0–36.0)
MCV: 92.1 fL (ref 80.0–100.0)
PLATELETS: 242 10*3/uL (ref 150–440)
RBC: 3.86 MIL/uL (ref 3.80–5.20)
RDW: 13.2 % (ref 11.5–14.5)
WBC: 10.8 10*3/uL (ref 3.6–11.0)

## 2016-09-30 LAB — RAPID HIV SCREEN (HIV 1/2 AB+AG)
HIV 1/2 Antibodies: NONREACTIVE
HIV-1 P24 ANTIGEN - HIV24: NONREACTIVE

## 2016-09-30 MED ORDER — BENZOCAINE-MENTHOL 20-0.5 % EX AERO: 1.0000 "application " | INHALATION_SPRAY | CUTANEOUS | Status: DC | PRN

## 2016-09-30 MED ORDER — HYDROCODONE-ACETAMINOPHEN 5-325 MG PO TABS
1.0000 | ORAL_TABLET | ORAL | Status: DC | PRN
  Administered 2016-10-01 – 2016-10-02 (×3): 1 via ORAL
  Filled 2016-09-30 (×3): qty 1

## 2016-09-30 MED ORDER — LIDOCAINE HCL (PF) 1 % IJ SOLN: 30.0000 mL | INTRAMUSCULAR | Status: DC | PRN

## 2016-09-30 MED ORDER — OXYTOCIN BOLUS FROM INFUSION: 500.0000 mL | Freq: Once | INTRAVENOUS | Status: DC

## 2016-09-30 MED ORDER — HYDROCODONE-ACETAMINOPHEN 5-325 MG PO TABS
2.0000 | ORAL_TABLET | ORAL | Status: DC | PRN
  Administered 2016-10-01 – 2016-10-02 (×6): 2 via ORAL
  Filled 2016-09-30 (×6): qty 2

## 2016-09-30 MED ORDER — WITCH HAZEL-GLYCERIN EX PADS: 1.0000 "application " | MEDICATED_PAD | CUTANEOUS | Status: DC | PRN

## 2016-09-30 MED ORDER — SENNOSIDES-DOCUSATE SODIUM 8.6-50 MG PO TABS
2.0000 | ORAL_TABLET | ORAL | Status: DC
  Administered 2016-10-01: 2 via ORAL
  Filled 2016-09-30: qty 2

## 2016-09-30 MED ORDER — DIPHENHYDRAMINE HCL 25 MG PO CAPS: 25.0000 mg | ORAL_CAPSULE | Freq: Four times a day (QID) | ORAL | Status: DC | PRN

## 2016-09-30 MED ORDER — SOD CITRATE-CITRIC ACID 500-334 MG/5ML PO SOLN: 30.0000 mL | ORAL | Status: DC | PRN

## 2016-09-30 MED ORDER — AMMONIA AROMATIC IN INHA
RESPIRATORY_TRACT | Status: AC
  Filled 2016-09-30: qty 10

## 2016-09-30 MED ORDER — OXYTOCIN 10 UNIT/ML IJ SOLN
INTRAMUSCULAR | Status: AC
Start: 2016-09-30 — End: 2016-10-01
  Filled 2016-09-30: qty 2

## 2016-09-30 MED ORDER — LACTATED RINGERS IV SOLN: INTRAVENOUS | Status: DC

## 2016-09-30 MED ORDER — ONDANSETRON HCL 4 MG/2ML IJ SOLN: 4.0000 mg | Freq: Four times a day (QID) | INTRAMUSCULAR | Status: DC | PRN

## 2016-09-30 MED ORDER — LACTATED RINGERS IV SOLN: 500.0000 mL | INTRAVENOUS | Status: DC | PRN

## 2016-09-30 MED ORDER — ONDANSETRON HCL 4 MG PO TABS: 4.0000 mg | ORAL_TABLET | ORAL | Status: DC | PRN

## 2016-09-30 MED ORDER — FERROUS SULFATE 325 (65 FE) MG PO TABS
325.0000 mg | ORAL_TABLET | Freq: Two times a day (BID) | ORAL | Status: DC
  Administered 2016-10-01 – 2016-10-02 (×3): 325 mg via ORAL
  Filled 2016-09-30 (×3): qty 1

## 2016-09-30 MED ORDER — PRENATAL MULTIVITAMIN CH
1.0000 | ORAL_TABLET | Freq: Every day | ORAL | Status: DC
  Administered 2016-10-01 – 2016-10-02 (×2): 1 via ORAL
  Filled 2016-09-30 (×2): qty 1

## 2016-09-30 MED ORDER — DIBUCAINE 1 % RE OINT: 1.0000 "application " | TOPICAL_OINTMENT | RECTAL | Status: DC | PRN

## 2016-09-30 MED ORDER — SIMETHICONE 80 MG PO CHEW: 80.0000 mg | CHEWABLE_TABLET | ORAL | Status: DC | PRN

## 2016-09-30 MED ORDER — SODIUM CHLORIDE 0.9 % IV SOLN
2.0000 g | Freq: Once | INTRAVENOUS | Status: AC
  Administered 2016-09-30: 2 g via INTRAVENOUS
  Filled 2016-09-30: qty 2000

## 2016-09-30 MED ORDER — ONDANSETRON HCL 4 MG/2ML IJ SOLN: 4.0000 mg | INTRAMUSCULAR | Status: DC | PRN

## 2016-09-30 MED ORDER — ACETAMINOPHEN 325 MG PO TABS
650.0000 mg | ORAL_TABLET | ORAL | Status: DC | PRN
Start: 2016-09-30 — End: 2016-10-02

## 2016-09-30 MED ORDER — COCONUT OIL OIL: 1.0000 "application " | TOPICAL_OIL | Status: DC | PRN

## 2016-09-30 MED ORDER — IBUPROFEN 600 MG PO TABS
ORAL_TABLET | ORAL | Status: AC
  Administered 2016-09-30: 600 mg via ORAL
  Filled 2016-09-30: qty 1

## 2016-09-30 MED ORDER — LIDOCAINE HCL (PF) 1 % IJ SOLN
INTRAMUSCULAR | Status: AC
  Filled 2016-09-30: qty 30

## 2016-09-30 MED ORDER — OXYTOCIN 10 UNIT/ML IJ SOLN
10.0000 [IU] | Freq: Once | INTRAMUSCULAR | Status: DC
  Administered 2016-09-30: 10 [IU] via INTRAMUSCULAR

## 2016-09-30 MED ORDER — IBUPROFEN 600 MG PO TABS
600.0000 mg | ORAL_TABLET | Freq: Four times a day (QID) | ORAL | Status: DC
  Administered 2016-10-01 – 2016-10-02 (×6): 600 mg via ORAL
  Filled 2016-09-30 (×6): qty 1

## 2016-09-30 MED ORDER — OXYTOCIN 40 UNITS IN LACTATED RINGERS INFUSION - SIMPLE MED: 2.5000 [IU]/h | INTRAVENOUS | Status: DC

## 2016-09-30 NOTE — Discharge Summary (Signed)
OB Discharge Summary     Patient Name: Paige Pierce DOB: 02/01/1986 MRN: [id number]  Date of admission: 09/30/2016 Delivering MD: Conard NovakJackson, Stephen D, MD  Date of Delivery: 09/30/2016  Date of discharge: 10/02/2016  Admitting diagnosis: no prenatal care, unknown gestational age Intrauterine pregnancy: unknown gestational age Secondary diagnosis: GHTN, fetus cocaine positive     Discharge diagnosis: Term Pregnancy Delivered and no prenatal care, history of drug use                                                                                                Post partum procedures:none  Augmentation: AROM  Complications: None  Hospital course:  Onset of Labor With Vaginal Delivery     31 y.o. yo G7P2 at Unknown was admitted in Active Labor on 09/30/2016. Patient had an uncomplicated labor course as follows:  Membrane Rupture Time/Date: 5:44 PM ,09/30/2016   Intrapartum Procedures: Episiotomy: None [1]                                         Lacerations:  None [1]  Patient had a delivery of a Viable infant. 09/30/2016  Information for the patient's newborn:  Paige Pierce, [place] [[id number]][[id number]]  Delivery Method: Vag-Spont    Pateint had an uncomplicated postpartum course.  She is ambulating, tolerating a regular diet, passing flatus, and urinating well. Patient is discharged home in stable condition on 10/02/16.    Physical exam  Vitals:   10/01/16 2034 10/02/16 0003 10/02/16 0339 10/02/16 0806  BP: (!) 146/95 (!) 138/91 (!) 147/80 135/90  Pulse: 69 70 62 (!) 53  Resp:  16 16 18   Temp:  98.4 F (36.9 C) 98.7 F (37.1 C) 97.8 F (36.6 C)  TempSrc:  Oral Oral Oral  SpO2: 99% 100% 100%    General: alert, cooperative and no distress Lochia: appropriate Uterine Fundus: firm Incision: N/A DVT Evaluation: No evidence of DVT seen on physical exam. No cords or calf tenderness. No significant calf/ankle edema.  Labs: Lab Results  Component Value Date   WBC 11.8 (H) 10/01/2016   HGB 10.5  (L) 10/01/2016   HCT 31.3 (L) 10/01/2016   MCV 91.9 10/01/2016   PLT 183 10/01/2016   CMP Latest Ref Rng & Units 04/08/2015  Glucose 65 - 99 mg/dL 81  BUN 6 - 20 mg/dL <7(Q<5(L)  Creatinine 4.690.44 - 1.00 mg/dL 6.29(B1.38(H)  Sodium 284135 - 132145 mmol/L 134(L)  Potassium 3.5 - 5.1 mmol/L 3.3(L)  Chloride 101 - 111 mmol/L 104  CO2 22 - 32 mmol/L 22  Calcium 8.9 - 10.3 mg/dL 8.0(L)  Total Protein 6.5 - 8.1 g/dL 4.4(W5.8(L)  Total Bilirubin 0.3 - 1.2 mg/dL 0.7  Alkaline Phos 38 - 126 U/L 134(H)  AST 15 - 41 U/L 20  ALT 14 - 54 U/L 13(L)    Discharge instruction: per After Visit Summary.  Medications:  Allergies as of 10/02/2016   No Known Allergies     Medication List    TAKE these medications  ibuprofen 600 MG tablet Commonly known as:  ADVIL,MOTRIN Take 1 tablet (600 mg total) by mouth every 6 (six) hours.   multivitamin-prenatal 27-0.8 MG Tabs tablet Take 1 tablet by mouth daily at 12 noon.       Diet: routine diet  Activity: Advance as tolerated. Pelvic rest for 6 weeks.   Outpatient follow up: Follow-up Information    Conard Novak, MD Follow up in 1 week(s).   Specialty:  Obstetrics and Gynecology Why:  BP check Contact information: [address] [place] [address] [phone]             Postpartum contraception: Depo Provera Rhogam Given postpartum: no Rubella vaccine given postpartum: N/A Varicella vaccine given postpartum: no TDaP given antepartum or postpartum: yes  Newborn Data: Live born female  Birth Weight: 3,330 grams APGAR: 5, 7   Baby Feeding: Bottle  Disposition:home with mother  SIGNED: Vena Austria, MD

## 2016-09-30 NOTE — H&P (Signed)
OB History & Physical   History of Present Illness:  Chief Complaint: painful contractions  HPI:  Paige Pierce is a 31 y.o. G7 (parity in question - listed as P6 in chart) female at Unknown gestational age due to no prenatal care.  She does not recall her last menstrual period.  Her pregnancy has been complicated by no prenatal care, history of drug abuse.    She reports contractions.   She denies leakage of fluid.   She denies vaginal bleeding.   She reports fetal movement.    Maternal Medical History:  Past Medical history: none  Past Surgical History: none  Allergies: No Known Allergies  Prior to Admission medications   Medication Sig Start Date End Date Taking? Authorizing Provider  Prenatal Vit-Fe Fumarate-FA (MULTIVITAMIN-PRENATAL) 27-0.8 MG TABS tablet Take 1 tablet by mouth daily at 12 noon.    Historical Provider, MD    OB History  Gravida Para Term Preterm AB Living  7 1       1   SAB TAB Ectopic Multiple Live Births        0 1    # Outcome Date GA Lbr Len/2nd Weight Sex Delivery Anes PTL Lv  7 Current           6 Para 04/09/15  10:39 / 00:29 7 lb 6.2 oz (3.35 kg) M Vag-Spont EPI  LIV     Birth Comments: Infant has extra digit on both hands  5 Gravida 09/26/12     Vag-Spont     4 Gravida 09/26/10     Vag-Spont     3 Gravida 09/26/08     Vag-Spont     2 Gravida 09/26/06     Vag-Spont     1 Gravida 09/26/02     Vag-Spont         Prenatal care site: None  Social History: She  reports that she has been smoking Cigarettes.  She has been smoking about 0.25 packs per day. She does not have any smokeless tobacco history on file. She reports that she uses drugs, including Marijuana and Cocaine. She reports that she does not drink alcohol.  (denied Cocaine use during my interview)  Family History: family history includes Cancer in her mother.   Review of Systems: Negative x 10 systems reviewed except as noted in the HPI.    Physical Exam:  Vital Signs: BP (!) 164/82  (BP Location: Right Arm)   Pulse 72   Temp 97.8 F (36.6 C)   Resp 20  General: no acute distress.  HEENT: normocephalic, atraumatic Heart: regular rate & rhythm.  No murmurs/rubs/gallops Lungs: clear to auscultation bilaterally Abdomen: soft, gravid, non-tender;  EFW: 8.5 pounds Pelvic: (female chaperone present during pelvic exam)  External: Normal external female genitalia  Cervix: Dilation: 8 / Effacement (%): 100 / Station: -2   AROM: moderate to thick meconium Extremities: non-tender, symmetric, no edema bilaterally.  DTRs: 2+  Neurologic: Alert & oriented x 3.    PNL(from admission in 03/2015) AB pos / ABSC Anti-A1 / Rubella Equivocal / RPR NR / HIV neg / HBsAg neg / HepC neg / 1-hr no on record / LSIL pap / GBS unknown   Baseline FHR: 120 beats/min   Variability: moderate   Accelerations: absent   Decelerations: absent Contractions: present frequency: 4-5 q 10 Overall assessment: category 1  Bedside Ultrasound:  Number of Fetus: 1  Presentation: cephalic  Fluid: subjectively normal  Placental Location: unable to visualize with patient  thrashing in the bed  Assessment:  Paige Pierce is a 31 y.o. G69P1 female at Unknown gestational age with active labor, no prenatal care, history of drug abuse.   Plan:  1. Admit to Labor & Delivery  2. Prenatal panel (patient refused lab draw on admission as she states she was in too much pain. Stated we would have to wait.) 3. Clrs, IVF 4. GBS unknown - ampicillin 2 grams stat.   5. Fetwal well-being: reassuring overall   Conard Novak, MD 09/30/2016 6:18 PM

## 2016-09-30 NOTE — OB Triage Note (Signed)
Pt. Arrived via stretcher, EMS with IV located in right forearm. Pt. Screaming and crying in pain.  LR at 125 ml/hr started.  Pt. Refuse blood draw.

## 2016-10-01 LAB — CBC
HEMATOCRIT: 31.3 % — AB (ref 35.0–47.0)
HEMOGLOBIN: 10.5 g/dL — AB (ref 12.0–16.0)
MCH: 30.8 pg (ref 26.0–34.0)
MCHC: 33.5 g/dL (ref 32.0–36.0)
MCV: 91.9 fL (ref 80.0–100.0)
Platelets: 183 10*3/uL (ref 150–440)
RBC: 3.41 MIL/uL — AB (ref 3.80–5.20)
RDW: 13.5 % (ref 11.5–14.5)
WBC: 11.8 10*3/uL — ABNORMAL HIGH (ref 3.6–11.0)

## 2016-10-01 LAB — URINE DRUG SCREEN, QUALITATIVE (ARMC ONLY)
Amphetamines, Ur Screen: NOT DETECTED
BARBITURATES, UR SCREEN: NOT DETECTED
Benzodiazepine, Ur Scrn: NOT DETECTED
CANNABINOID 50 NG, UR ~~LOC~~: NOT DETECTED
Cocaine Metabolite,Ur ~~LOC~~: NOT DETECTED
MDMA (ECSTASY) UR SCREEN: NOT DETECTED
Methadone Scn, Ur: NOT DETECTED
OPIATE, UR SCREEN: NOT DETECTED
PHENCYCLIDINE (PCP) UR S: NOT DETECTED
Tricyclic, Ur Screen: NOT DETECTED

## 2016-10-01 LAB — HEPATITIS B SURFACE ANTIGEN: Hepatitis B Surface Ag: NEGATIVE

## 2016-10-01 LAB — CHLAMYDIA/NGC RT PCR (ARMC ONLY)
CHLAMYDIA TR: NOT DETECTED
N gonorrhoeae: NOT DETECTED

## 2016-10-01 LAB — RUBELLA SCREEN: Rubella: 1.38 index (ref >0.99)

## 2016-10-01 LAB — RPR: RPR Ser Ql: NONREACTIVE

## 2016-10-01 NOTE — Progress Notes (Signed)
Obstetric and Gynecology  Subjective  No concerns.  Minimal lochia.  Denies HA, vision changes, RUQ or epigastric pain, no increased edema  Objective   Vitals:   10/01/16 0502 10/01/16 0745  BP: (!) 145/71 (!) 151/92  Pulse: 74 67  Resp:  17  Temp:  98 F (36.7 C)     Intake/Output Summary (Last 24 hours) at 10/01/16 1133 Last data filed at 10/01/16 1040  Gross per 24 hour  Intake              120 ml  Output              300 ml  Net             -180 ml    General: NAD Cardiovascular: RRR Abdomen: soft, non-tender, fundus firma Extremities: no edema  Labs: Results for orders placed or performed during the hospital encounter of 09/30/16 (from the past 24 hour(s))  CBC     Status: None   Collection Time: 09/30/16  6:34 PM  Result Value Ref Range   WBC 10.8 3.6 - 11.0 K/uL   RBC 3.86 3.80 - 5.20 MIL/uL   Hemoglobin 12.2 12.0 - 16.0 g/dL   HCT 16.1 09.6 - 04.5 %   MCV 92.1 80.0 - 100.0 fL   MCH 31.6 26.0 - 34.0 pg   MCHC 34.3 32.0 - 36.0 g/dL   RDW 40.9 81.1 - 91.4 %   Platelets 242 150 - 440 K/uL  Type and screen [facility]     Status: None (Preliminary result)   Collection Time: 09/30/16  6:34 PM  Result Value Ref Range   ABO/RH(D) AB POS    Antibody Screen NEG    Sample Expiration 10/03/2016    Unit Number [id number]    Blood Component Type RED CELLS,LR    Unit division 00    Status of Unit ALLOCATED    Transfusion Status OK TO TRANSFUSE    Crossmatch Result COMPATIBLE    Unit Number [id number]    Blood Component Type RED CELLS,LR    Unit division 00    Status of Unit ALLOCATED    Transfusion Status OK TO TRANSFUSE    Crossmatch Result COMPATIBLE   RPR     Status: None   Collection Time: 09/30/16  6:34 PM  Result Value Ref Range   RPR Ser Ql Non Reactive Non Reactive  Rubella screen     Status: None   Collection Time: 09/30/16  6:34 PM  Result Value Ref Range   Rubella 1.38 Immune >0.99 index  Hepatitis B surface antigen      Status: None   Collection Time: 09/30/16  6:34 PM  Result Value Ref Range   Hepatitis B Surface Ag Negative Negative  Rapid HIV screen (HIV 1/2 Ab+Ag) ([facility] Only)     Status: None   Collection Time: 09/30/16  6:34 PM  Result Value Ref Range   HIV-1 P24 Antigen - HIV24 NON REACTIVE NON REACTIVE   HIV 1/2 Antibodies NON REACTIVE NON REACTIVE   Interpretation (HIV Ag Ab)      A non reactive test result means that HIV 1 or HIV 2 antibodies and HIV 1 p24 antigen were not detected in the specimen.  Urine Drug Screen, Qualitative ([facility] only)     Status: None   Collection Time: 10/01/16  1:03 AM  Result Value Ref Range   Tricyclic, Ur Screen NONE DETECTED NONE DETECTED   Amphetamines, Ur Screen NONE  DETECTED NONE DETECTED   MDMA (Ecstasy)Ur Screen NONE DETECTED NONE DETECTED   Cocaine Metabolite,Ur [place] NONE DETECTED NONE DETECTED   Opiate, Ur Screen NONE DETECTED NONE DETECTED   Phencyclidine (PCP) Ur S NONE DETECTED NONE DETECTED   Cannabinoid 50 Ng, Ur [place] NONE DETECTED NONE DETECTED   Barbiturates, Ur Screen NONE DETECTED NONE DETECTED   Benzodiazepine, Ur Scrn NONE DETECTED NONE DETECTED   Methadone Scn, Ur NONE DETECTED NONE DETECTED  Chlamydia/NGC rt PCR ([facility] only)     Status: None   Collection Time: 10/01/16  1:03 AM  Result Value Ref Range   Specimen source GC/Chlam URINE, RANDOM    Chlamydia Tr NOT DETECTED NOT DETECTED   N gonorrhoeae NOT DETECTED NOT DETECTED    Cultures: Results for orders placed or performed during the hospital encounter of 09/30/16  Chlamydia/NGC rt PCR ([facility] only)     Status: None   Collection Time: 10/01/16  1:03 AM  Result Value Ref Range Status   Specimen source GC/Chlam URINE, RANDOM  Final   Chlamydia Tr NOT DETECTED NOT DETECTED Final   N gonorrhoeae NOT DETECTED NOT DETECTED Final    Comment: (NOTE) 100  This methodology has not been evaluated in pregnant women or in 200  patients with a history of hysterectomy. 300 400  This methodology  will not be performed on patients less than 114  years of age.     Imaging:  Assessment:   31 y.o. [id number] postpartum day # 1 TSVD, GHTN  Plan:    1) Repeat CBC - patient declined lab draw this AM  2) --/--/A POS (01/05 1816) / Rubella Immune  3) TDAP status - needs  4) Bottle/Depo  5) GHTN - monitor BP, asymptomatic and mild range BP - repeat UDS  6) Disposition

## 2016-10-01 NOTE — Clinical Social Work Maternal (Signed)
CLINICAL SOCIAL WORK MATERNAL/CHILD NOTE  Patient Details  Name: Paige Pierce MRN: [id number] Date of Birth: 11/14/1985  Date:  10/01/2016  Clinical Social Worker Initiating Note:  Charlsie Fleeger Martha Florena Kozma, MSW LCSW-A      Date/ Time Initiated:  10/01/16/1600           Child's Name:  Unnamed at this time   Legal Guardian:  Mother   Need for Interpreter:  None   Date of Referral:  10/01/16     Reason for Referral:  Current Substance Use/Substance Use During Pregnancy , Late or No Prenatal Care    Referral Source:  RN   Address:  [address], [place]  Phone number:  [phone]   Household Members: Relatives   Natural Supports (not living in the home): Extended Family, Friends   Professional Supports:    Employment:Unemployed   Type of Work: not employed   Education:  9 to 11 years   Financial Resources:Medicaid   Other Resources: WIC   Cultural/Religious Considerations Which May Impact Care:None reported  Strengths: Ability to meet basic needs , Home prepared for child    Risk Factors/Current Problems: Compliance with Treatment , Substance Use    Cognitive State: Distractible , Poor Insight    Mood/Affect: Bright , Comfortable    CSW Assessment:CSW visited mother and baby at bedside to assess for CPS mandated reporting. MOB was bonding appropriately, and during assessment, began bottle feeding the baby boy appropriately. The MOB reports that she has not named the child. The MOB did not realize that she was pregnant until 2 months ago due to intermittent spotting. The FOB is not known; however, the patient has significant family support. The MOB has a car seat and bassinet for the baby and plans to follow-up at the health department in [place] for she and baby's well-care. She has 6 other children; 4 are permanently adopted by family members, and 2 are living with family until she is more stable. She recently  moved in with her cousin in [place].  CSW explained CPS mandated reporting process including need to report based on the baby's tox screen findings. CSW explained that the results of that screen are pending. MOB verbalized understanding and was not agitated. The MOB admitted to using cocaine and marijuana in the past 7 days. The MOB indicates that she is going to begin substance use treatment in [place] once discharged, but she did not remember the name of the agency. CSW offered resource list for [place], and the MOB pleasantly declined citing that she knows the information.  CSW will con't to follow pending the baby's tox screen. CPS will be informed if that screen is positive for any substances.   CSW Plan/Description: Child Protective Service Report , Information/Referral to Community Resources , Patient/Family Education  (CPS report once tox screen returns on child)    [place] Lemaster M Aydien Majette, LCSW 10/01/2016, 3:58 PM   CLINICAL SOCIAL WORK MATERNAL/CHILD NOTE  Patient Details  Name: Paige Pierce MRN: [id number] Date of Birth: Nov 17, 1985  Date:  10/01/2016  Clinical Social Worker Initiating Note:  Argentina Ponder, MSW LCSW-A Date/ Time Initiated:  10/01/16/1600     Child's Name:  Unnamed at this time   Legal Guardian:  Mother   Need for Interpreter:  None   Date of Referral:  10/01/16     Reason for Referral:  Current Substance Use/Substance Use During Pregnancy , Late or No Prenatal Care    Referral Source:  RN   Address:  [address], [place]  Phone number:  [phone]   Household Members:  Relatives   Natural Supports (not living in the home):  Extended Family, Friends   Radiographer, therapeutic Supports:     Employment: Unemployed   Type of Work: not employed   Education:  9 to 11 years   Surveyor, quantity Resources:  Medicaid   Other Resources:  [facility]   Cultural/Religious Considerations Which May Impact Care: None reported  Strengths:  Ability to meet basic needs , Home prepared for child    Risk Factors/Current Problems:  Compliance with Treatment , Substance Use    Cognitive State:  Distractible , Poor Insight    Mood/Affect:  Bright , Comfortable    CSW Assessment: CSW visited mother and baby at bedside to assess for CPS mandated reporting. MOB was bonding appropriately, and during assessment, began bottle feeding the baby boy appropriately. The MOB reports that she has not named the child. The MOB did not realize that she was pregnant until 2 months ago due to intermittent spotting. The FOB is not known; however, the patient has significant family support. The MOB has a car seat and bassinet for the baby and plans to follow-up at the health department in [facility] for she and baby's well-care. She has 6 other children; 4 are permanently adopted by family members, and 2 are living with family until she is more stable. She recently moved in with her cousin in  [place].  CSW explained CPS mandated reporting process including need to report based on the baby's tox screen findings. CSW explained that the results of that screen are pending. MOB verbalized understanding and was not agitated. The MOB admitted to using cocaine and marijuana in the past 7 days. The MOB indicates that she is going to begin substance use treatment in [place] once discharged, but she did not remember the name of the agency. CSW offered resource list for [facility], and the [employer] pleasantly declined citing that she knows the information.  CSW will con't to follow pending the baby's tox screen. CPS will be informed if that screen is positive for any substances.   CSW Plan/Description:  Child Protective Service Report , Information/Referral to [facility] , Patient/Family Education  (CPS report once tox screen returns on child)    Judi Cong, LCSW 10/01/2016, 3:58 PM

## 2016-10-02 LAB — URINE DRUG SCREEN, QUALITATIVE (ARMC ONLY)
Amphetamines, Ur Screen: NOT DETECTED
BARBITURATES, UR SCREEN: NOT DETECTED
Benzodiazepine, Ur Scrn: NOT DETECTED
CANNABINOID 50 NG, UR ~~LOC~~: NOT DETECTED
COCAINE METABOLITE, UR ~~LOC~~: NOT DETECTED
MDMA (Ecstasy)Ur Screen: NOT DETECTED
Methadone Scn, Ur: NOT DETECTED
OPIATE, UR SCREEN: NOT DETECTED
Phencyclidine (PCP) Ur S: NOT DETECTED
TRICYCLIC, UR SCREEN: NOT DETECTED

## 2016-10-02 MED ORDER — IBUPROFEN 600 MG PO TABS: 600.0000 mg | ORAL_TABLET | Freq: Four times a day (QID) | ORAL | 0 refills | Status: DC

## 2016-10-02 MED ORDER — MEDROXYPROGESTERONE ACETATE 150 MG/ML IM SUSP
150.0000 mg | Freq: Once | INTRAMUSCULAR | Status: DC
  Filled 2016-10-02: qty 1

## 2016-10-02 NOTE — Clinical Social Work Note (Addendum)
CSW made mandated report to [place] CPS due to the patient's new address in [place] CPS reported that they will initiate an investigation and that the patient is currently able to dc with her child. However, Casey, on-call for [place] DSS called to advise that the baby cannot dc with the MOB due to open CPS cases on her other 6 children. Casey also requested a fetal alcohol screen for the baby. CSW informed RN of this change and that Casey is on the way to assess the MOB.  Paige Pierce Paige Pierce, MSW, LCSW-A [phone] 

## 2016-10-02 NOTE — Progress Notes (Signed)
Discharged to home.  To car ambulatory with family.  [facility]

## 2016-10-02 NOTE — Progress Notes (Signed)
reviewed discharge instructions with pt.  Strongly encouraged to make appt  Tomorrow for 1 wk and to keep appt. To f/u about BP's.  Pt verbalized u/o

## 2016-10-02 NOTE — Progress Notes (Signed)
Pt refused Depo "I don't want that stuff".  Enc to take to prevent further pregnancies.  Rx given for home use of Motrin.  Pt threw on counter "I don't want that either".Marland Kitchen.i need the vicodin"  "Can you call the doctor"  Dr. Chauncey CruelStabler notified.  Will only give the Rx for Motrin d/t history.  Pt informed.  No comment from patient.

## 2016-10-04 LAB — TYPE AND SCREEN
ABO/RH(D): AB POS
Antibody Screen: NEGATIVE
UNIT DIVISION: 0
UNIT DIVISION: 0

## 2018-09-26 NOTE — L&D Delivery Note (Signed)
Delivery Note At 4:41 AM a viable female child was delivered via Vaginal, Spontaneous (Presentation: Left Occiput Anterior).  APGAR: 8, 9; weight 2450g.   Placenta status: Spontaneous, Intact.  Cord: 3 vessels with the following complications: None.  Cord pH: N/A  Anesthesia: None Episiotomy: None Lacerations:  none Suture Repair: N/A Est. Blood Loss (mL):  445mL  Mom to postpartum.  Baby to Couplet care / Skin to Skin.  Malachy Mood 09/08/2019, 5:00 AM

## 2019-05-31 ENCOUNTER — Other Ambulatory Visit: Payer: Self-pay

## 2019-05-31 ENCOUNTER — Observation Stay
Admission: EM | Admit: 2019-05-31 | Discharge: 2019-05-31 | Disposition: A | Discharge status: Home / Self Care | Payer: Medicaid Other | Attending: Obstetrics & Gynecology | Admitting: Obstetrics & Gynecology

## 2019-05-31 ENCOUNTER — Observation Stay: Payer: Medicaid Other

## 2019-05-31 DIAGNOSIS — Z3A22 22 weeks gestation of pregnancy: Secondary | ICD-10-CM | POA: Insufficient documentation

## 2019-05-31 DIAGNOSIS — O99332 Smoking (tobacco) complicating pregnancy, second trimester: Secondary | ICD-10-CM | POA: Diagnosis not present

## 2019-05-31 DIAGNOSIS — Z363 Encounter for antenatal screening for malformations: Secondary | ICD-10-CM

## 2019-05-31 DIAGNOSIS — E876 Hypokalemia: Secondary | ICD-10-CM | POA: Insufficient documentation

## 2019-05-31 DIAGNOSIS — O26892 Other specified pregnancy related conditions, second trimester: Principal | ICD-10-CM | POA: Insufficient documentation

## 2019-05-31 DIAGNOSIS — R109 Unspecified abdominal pain: Secondary | ICD-10-CM | POA: Diagnosis not present

## 2019-05-31 DIAGNOSIS — O0930 Supervision of pregnancy with insufficient antenatal care, unspecified trimester: Secondary | ICD-10-CM

## 2019-05-31 DIAGNOSIS — Z3A23 23 weeks gestation of pregnancy: Secondary | ICD-10-CM

## 2019-05-31 DIAGNOSIS — O99282 Endocrine, nutritional and metabolic diseases complicating pregnancy, second trimester: Secondary | ICD-10-CM | POA: Diagnosis not present

## 2019-05-31 LAB — URINALYSIS, COMPLETE (UACMP) WITH MICROSCOPIC
Bacteria, UA: NONE SEEN
Bilirubin Urine: NEGATIVE
Glucose, UA: NEGATIVE mg/dL
Hgb urine dipstick: NEGATIVE
Ketones, ur: 80 mg/dL — AB
Leukocytes,Ua: NEGATIVE
Nitrite: NEGATIVE
Protein, ur: NEGATIVE mg/dL
Specific Gravity, Urine: 1.011 (ref 1.005–1.030)
pH: 6 (ref 5.0–8.0)

## 2019-05-31 LAB — COMPREHENSIVE METABOLIC PANEL
ALT: 9 U/L (ref 0–44)
AST: 16 U/L (ref 15–41)
Albumin: 3.6 g/dL (ref 3.5–5.0)
Alkaline Phosphatase: 52 U/L (ref 38–126)
Anion gap: 16 — ABNORMAL HIGH (ref 5–15)
BUN: <5 mg/dL — ABNORMAL LOW (ref 6–20)
CO2: 18 mmol/L — ABNORMAL LOW (ref 22–32)
Calcium: 8.1 mg/dL — ABNORMAL LOW (ref 8.9–10.3)
Chloride: 100 mmol/L (ref 98–111)
Creatinine, Ser: 0.42 mg/dL — ABNORMAL LOW (ref 0.44–1.00)
GFR calc Af Amer: >60 mL/min (ref >60)
GFR calc non Af Amer: >60 mL/min (ref >60)
Glucose, Bld: 87 mg/dL (ref 70–99)
Potassium: 2.4 mmol/L — CL (ref 3.5–5.1)
Sodium: 134 mmol/L — ABNORMAL LOW (ref 135–145)
Total Bilirubin: 1 mg/dL (ref 0.3–1.2)
Total Protein: 6.6 g/dL (ref 6.5–8.1)

## 2019-05-31 LAB — CBC
HCT: 31.5 % — ABNORMAL LOW (ref 36.0–46.0)
Hemoglobin: 11.2 g/dL — ABNORMAL LOW (ref 12.0–15.0)
MCH: 33.2 pg (ref 26.0–34.0)
MCHC: 35.6 g/dL (ref 30.0–36.0)
MCV: 93.5 fL (ref 80.0–100.0)
Platelets: 222 10*3/uL (ref 150–400)
RBC: 3.37 MIL/uL — ABNORMAL LOW (ref 3.87–5.11)
RDW: 12.5 % (ref 11.5–15.5)
WBC: 8.3 10*3/uL (ref 4.0–10.5)
nRBC: 0 % (ref 0.0–0.2)

## 2019-05-31 LAB — URINE DRUG SCREEN, QUALITATIVE (ARMC ONLY)
Amphetamines, Ur Screen: NOT DETECTED
Barbiturates, Ur Screen: NOT DETECTED
Benzodiazepine, Ur Scrn: NOT DETECTED
Cannabinoid 50 Ng, Ur ~~LOC~~: POSITIVE — AB
Cocaine Metabolite,Ur ~~LOC~~: POSITIVE — AB
MDMA (Ecstasy)Ur Screen: NOT DETECTED
Methadone Scn, Ur: NOT DETECTED
Opiate, Ur Screen: NOT DETECTED
Phencyclidine (PCP) Ur S: NOT DETECTED
Tricyclic, Ur Screen: NOT DETECTED

## 2019-05-31 LAB — CHLAMYDIA/NGC RT PCR (ARMC ONLY)
Chlamydia Tr: NOT DETECTED
N gonorrhoeae: NOT DETECTED

## 2019-05-31 LAB — PROTEIN / CREATININE RATIO, URINE
Creatinine, Urine: 109 mg/dL
Protein Creatinine Ratio: 0.11 mg/mg{Cre} (ref 0.00–0.15)
Total Protein, Urine: 12 mg/dL

## 2019-05-31 LAB — ABO/RH: ABO/RH(D): AB POS

## 2019-05-31 MED ORDER — ACETAMINOPHEN 325 MG PO TABS: 650.0000 mg | ORAL_TABLET | ORAL | Status: DC | PRN

## 2019-05-31 MED ORDER — POTASSIUM CHLORIDE CRYS ER 10 MEQ PO TBCR
40.0000 meq | EXTENDED_RELEASE_TABLET | ORAL | Status: DC
  Administered 2019-05-31 (×2): 40 meq via ORAL
  Filled 2019-05-31 (×2): qty 4

## 2019-05-31 MED ORDER — POTASSIUM CHLORIDE CRYS ER 10 MEQ PO TBCR: 40.0000 meq | EXTENDED_RELEASE_TABLET | ORAL | Status: DC

## 2019-05-31 MED ORDER — POTASSIUM CHLORIDE CRYS ER 20 MEQ PO TBCR: 40.0000 meq | EXTENDED_RELEASE_TABLET | Freq: Once | ORAL | 0 refills | Status: DC

## 2019-05-31 MED ORDER — TERBUTALINE SULFATE 1 MG/ML IJ SOLN
0.2500 mg | Freq: Once | INTRAMUSCULAR | Status: AC
  Administered 2019-05-31: 07:00:00 0.25 mg via SUBCUTANEOUS

## 2019-05-31 MED ORDER — PROMETHAZINE HCL 25 MG PO TABS: 25.0000 mg | ORAL_TABLET | Freq: Four times a day (QID) | ORAL | 2 refills | Status: DC | PRN

## 2019-05-31 MED ORDER — TERBUTALINE SULFATE 1 MG/ML IJ SOLN
INTRAMUSCULAR | Status: AC
  Administered 2019-05-31: 0.25 mg via SUBCUTANEOUS
  Filled 2019-05-31: qty 1

## 2019-05-31 MED ORDER — LACTATED RINGERS IV SOLN
INTRAVENOUS | Status: DC
  Administered 2019-05-31 (×2): via INTRAVENOUS

## 2019-05-31 MED ORDER — LIDOCAINE HCL (PF) 1 % IJ SOLN: 30.0000 mL | INTRAMUSCULAR | Status: DC | PRN

## 2019-05-31 MED ORDER — ONDANSETRON HCL 4 MG/2ML IJ SOLN: 4.0000 mg | Freq: Four times a day (QID) | INTRAMUSCULAR | Status: DC | PRN

## 2019-05-31 MED ORDER — BETAMETHASONE SOD PHOS & ACET 6 (3-3) MG/ML IJ SUSP: 12.0000 mg | INTRAMUSCULAR | Status: DC

## 2019-05-31 NOTE — Discharge Summary (Signed)
Physician Final Progress Note  Patient ID: Paige Pierce MRN: [id number] DOB/AGE: 1986/06/02 33 y.o.  Admit date: 05/31/2019 Admitting provider: Gae Dry, MD Discharge date: 05/31/2019   Admission Diagnoses: Abdominal pain in pregnancy, no prenatal care  Discharge Diagnoses:  Active Problems:   No prenatal care in current pregnancy Hypokalemia  History of Present Illness: The patient is a 33 y.o. female G7P6 at [redacted]w[redacted]d who presented for lower abdominal pain consistent with contractions for several hours at home. She was aware of her pregnancy but has not had any prenatal care before this visit to triage. Her cervix was closed on exam. An ultrasound was completed confirming gestational age of [redacted] weeks, 6 days, no signs of placental abruption or shortening of cervix. Normal fetal anatomy (with spine and nuchal region suboptimally visualized) and FHT. Her urine drug screen was positive for cocaine and marijuana. She received terbutaline and IV hydration and her contractions spaced out and then ceased. She was found to have hypokalemia and received 2 oral doses of potassium (total of 80 M/Eq). Admitted to nausea/vomiting at home. She was offered betamethasone but refused this today due to not wanting to receive an injection.  Review of Systems: Review of systems negative unless otherwise noted in HPI.   No past medical history on file.  No past surgical history on file.  Current Facility-Administered Medications on File Prior to Encounter  Medication Dose Route Frequency Provider Last Rate Last Dose  . bupivacaine (PF) (MARCAINE) 0.25 % injection    Anesthesia Intra-op Doreen Salvage, CRNA   4 mL at 04/08/15 1722  . lidocaine-EPINEPHrine 1.5 %-1:200000 injection    Anesthesia Intra-op Doreen Salvage, CRNA   3 mL at 04/08/15 1719   Current Outpatient Medications on File Prior to Encounter  Medication Sig Dispense Refill  . ibuprofen (ADVIL,MOTRIN) 600 MG tablet Take 1 tablet (600 mg total)  by mouth every 6 (six) hours. (Patient not taking: Reported on 05/31/2019) 30 tablet 0  . Prenatal Vit-Fe Fumarate-FA (MULTIVITAMIN-PRENATAL) 27-0.8 MG TABS tablet Take 1 tablet by mouth daily at 12 noon.      No Known Allergies  Social History   Socioeconomic History  . Marital status: Single    Spouse name: Not on file  . Number of children: Not on file  . Years of education: Not on file  . Highest education level: Not on file  Occupational History  . Not on file  Social Needs  . Financial resource strain: Not on file  . Food insecurity    Worry: Not on file    Inability: Not on file  . Transportation needs    Medical: Not on file    Non-medical: Not on file  Tobacco Use  . Smoking status: Current Every Day Smoker    Packs/day: 0.25    Types: Cigarettes  Substance and Sexual Activity  . Alcohol use: No  . Drug use: Yes    Types: Marijuana, Cocaine  . Sexual activity: Yes    Birth control/protection: Pill  Lifestyle  . Physical activity    Days per week: Not on file    Minutes per session: Not on file  . Stress: Not on file  Relationships  . Social Herbalist on phone: Not on file    Gets together: Not on file    Attends religious service: Not on file    Active member of club or organization: Not on file    Attends meetings of clubs or organizations:  Not on file    Relationship status: Not on file  . Intimate partner violence    Fear of current or ex partner: Not on file    Emotionally abused: Not on file    Physically abused: Not on file    Forced sexual activity: Not on file  Other Topics Concern  . Not on file  Social History Narrative  . Not on file    Family history: Negative/unremarkable except as detailed in HPI. No family history of birth defects.   Physical Exam: BP 138/72 (BP Location: Right Arm)   Pulse 94   Resp 18   Ht 5\' 3"  (1.6 m)   Wt 56.7 kg   BMI 22.14 kg/m   Gen: NAD Pulm: No increased work of breathing Abdomen:  gravid, non-tender Psych: mood appropriate, affect full Ext: no signs of DVT  FHT 144 bpm Toco: quiet  Significant Findings/ Diagnostic Studies: hypokalemia (potassium 2.4),   Procedures: Ultrasound  Discharge Condition: stable  Disposition: Discharge disposition: 01-Home or Self Care       Diet: Regular diet  Discharge Activity: Activity as tolerated  Discharge Instructions    Discharge activity:  No Restrictions   Complete by: As directed    Discharge diet:  No restrictions   Complete by: As directed    No sexual activity restrictions   Complete by: As directed    Notify physician for a general feeling that "something is not right"   Complete by: As directed    Notify physician for increase or change in vaginal discharge   Complete by: As directed    Notify physician for intestinal cramps, with or without diarrhea, sometimes described as "gas pain"   Complete by: As directed    Notify physician for leaking of fluid   Complete by: As directed    Notify physician for low, dull backache, unrelieved by heat or Tylenol   Complete by: As directed    Notify physician for menstrual like cramps   Complete by: As directed    Notify physician for pelvic pressure   Complete by: As directed    Notify physician for uterine contractions.  These may be painless and feel like the uterus is tightening or the baby is  "balling up"   Complete by: As directed    Notify physician for vaginal bleeding   Complete by: As directed    PRETERM LABOR:  Includes any of the follwing symptoms that occur between 20 - [redacted] weeks gestation.  If these symptoms are not stopped, preterm labor can result in preterm delivery, placing your baby at risk   Complete by: As directed      Allergies as of 05/31/2019   No Known Allergies     Medication List    STOP taking these medications   ibuprofen 600 MG tablet Commonly known as: ADVIL     TAKE these medications   multivitamin-prenatal 27-0.8 MG  Tabs tablet Take 1 tablet by mouth daily at 12 noon.   potassium chloride SA 20 MEQ tablet Commonly known as: K-DUR Take 2 tablets (40 mEq total) by mouth once for 1 dose.   promethazine 25 MG tablet Commonly known as: PHENERGAN Take 1 tablet (25 mg total) by mouth every 6 (six) hours as needed for nausea or vomiting.      Follow-up Information    Nadara Mustard, MD Follow up on 06/13/2019.   Specialty: Obstetrics and Gynecology Why: New OB appointment at 10:00. Please arrive 15 minutes  early to complete paperwork. Contact information: [address] [place] [address] [phone]          Findings reassuring for absence of preterm labor. I discussed with the patient the importance of discontinuing the use of marijuana and especaily cocaine. I advised of the potential for adverse fetal and maternal outcomes with continued cocaine use, including placental abruption, myocardial infarction, and demise.   Reviewed importance of potassium for normal body functions and the danger of having low potassium, especially cardiac problems. She will pick up the third oral dose from her pharmacy along with an antiemetic to prevent further loss through vomiting.   Although betamethasone was offered in the setting of fetal periviability, she chose not to receive this treatment today. Expressed importance of returning with symptoms of preterm labor or chest pain.   She agreed [facility] and is aware of the upcoming appointment made for her for 06/13/2019 at 1000 (first available visit for NOB).  Signed: Oswaldo ConroyJacelyn Y Schmid, CNM  05/31/2019, 4:45 PM

## 2019-05-31 NOTE — H&P (Signed)
Obstetrics Admission History & Physical   Contractions   HPI:  33 y.o. [id number] AA F with no prenatal care and LMP estimated 5 mos ago.  Admitted on 05/31/2019: She reports she has known she was pregnant but did not seek care, has done this in past and always made it to full term. She reports painful lower abdominal intermittent pains c/w cts this am for several hours, no relief with any measures.  No VB or ROM.  Pos FM.  Denies recent infection, trauma, drug use.  Reports stress.  Prior vaginal deliveries.  ROS: A review of systems was performed and negative, except as stated in the above HPI. PMHx: No past medical history on file. PSHx: No past surgical history on file. Medications:  Medications Prior to Admission  Medication Sig Dispense Refill Last Dose  . ibuprofen (ADVIL,MOTRIN) 600 MG tablet Take 1 tablet (600 mg total) by mouth every 6 (six) hours. (Patient not taking: Reported on 05/31/2019) 30 tablet 0 Unknown at Unknown time  . Prenatal Vit-Fe Fumarate-FA (MULTIVITAMIN-PRENATAL) 27-0.8 MG TABS tablet Take 1 tablet by mouth daily at 12 noon.   Unknown at Unknown time   Allergies: has No Known Allergies. OBHx:  OB History  Gravida Para Term Preterm AB Living  9 3       6   SAB TAB Ectopic Multiple Live Births        0 6    # Outcome Date GA Lbr Len/2nd Weight Sex Delivery Anes PTL Lv  9 Current           8 Para 09/30/16   3330 g M Vag-Spont None  LIV  7 Para 04/09/15  10:39 / 00:29 3350 g M Vag-Spont EPI  LIV     Birth Comments: Infant has extra digit on both hands  6 Gravida 09/26/12     Vag-Spont     5 Gravida 09/26/10     Vag-Spont     4 Gravida 09/26/08     Vag-Spont     3 Gravida 09/26/06     Vag-Spont     2 Gravida 09/26/02     Vag-Spont     1 Para            AVW:[id number]:Negative/unremarkable except as detailed in HPI.Marland Kitchen.  No family history of birth defects. Soc Hx: Former smoker, Recreational drug use: former and Pregnancy unplanned  Objective:   Vitals:   05/31/19 0650  05/31/19 0705  BP: 129/81 138/72  Pulse: 87 94  Resp:     Constitutional: Well nourished, well developed female in no acute distress.  HEENT: normal Skin: Warm and dry.  Cardiovascular:Regular rate and rhythm.   Extremity: trace to 1+ bilateral pedal edema Respiratory: Clear to auscultation bilateral. Normal respiratory effort Abdomen: gravid, ND, FHT present, moderate tenderness on exam Back: no CVAT Neuro: DTRs 2+, Cranial nerves grossly intact Psych: Alert and Oriented x3. No memory deficits. Normal mood and affect.  MS: normal gait, normal bilateral lower extremity ROM/strength/stability.  Pelvic exam: is not limited by body habitus EGBUS: within normal limits Vagina: within normal limits and with normal mucosa Cervix: EXTERNAL GENITALIA: normal appearing vulva with no masses, tenderness or lesions CERVIX: 0 cm dilated, 30 effaced, high/ballottable station Uterus: Spontaneous uterine activity  Adnexa: not evaluated  EFM:FHR: 150 bpm   Perinatal info:  Blood type: AB positive Rubella- Unknown Varicella -Unknown TDaP tetanus status unknown to the patient  Pt informed that the ultrasound is considered a limited OB ultrasound and is  not intended to be a complete ultrasound exam.  Patient also informed that the ultrasound is not being completed with the intent of assessing for fetal or placental anomalies or any pelvic abnormalities.  Explained that the purpose of today's ultrasound is to assess for  gestation age determination, presentation and viability.  Patient acknowledges the purpose of the exam and the limitations of the study.    ULTRASOUND REPORT  Location: [facility], Bedside Date of Service: 05/31/2019   Findings:  Nelda Marseille intrauterine pregnancy is visualized with FHR at 150 BPM.   Fetal presentation is Transverse.   Placenta: fundal.   AFI: subjectively normal.  Anatomic survey is performed for biometry; EGA [redacted] weeks w [facility] 09/29/19.   There is no free peritoneal  fluid in the cul de sac.  Impression: 1. Unknown Viable Singleton Intrauterine pregnancy previously established criteria. 2. EDC establishes as 23 weeks w [facility] 09/29/2019  Recommendations: 1.Clinical correlation with the patient's History and Physical Exam.  Hoyt Koch, MD    Assessment & Plan:   33 y.o. Y4I3 Admitted on 05/31/2019:She is 23 weeks based on ultrasound today.  Abdominal pain c/w contractions. Terbutaline Labs Monitoring  BMZ Risks of prematurity discussed  Barnett Applebaum, MD, [facility], [facility] 05/31/2019  7:18 AM

## 2019-05-31 NOTE — OB Triage Note (Signed)
Patient arrived in triage with complaints of contractions that started approx 1.5 hours ago that are 3-5 mins apart. She is rating them a 9/10 and feels the pain in her lower abdomen. Patient has not had any prenatal care this pregnancy and does not know how far along she is but states "I don't think its time for the baby to come out yet." Patient thinks she is about 5 or 6 months along. Patient states this is her 5th pregnancy and she has had 4 term pregnancies with no complications. Denies leaking of fluid or vaginal bleeding. Reports fetal movement noted. Difficulty continuously monitoring FHT's with EFM due to fetal size/ movement. RN remains at bedside attempting to relocate FHT's. [place] to bedside, Dr. Kenton Kingfisher en route.

## 2019-06-01 LAB — HEPATITIS B SURFACE ANTIGEN: Hepatitis B Surface Ag: NEGATIVE

## 2019-06-01 LAB — RPR: RPR Ser Ql: NONREACTIVE

## 2019-06-02 LAB — RUBELLA SCREEN: Rubella: 1.21 index (ref >0.99)

## 2019-06-13 ENCOUNTER — Encounter: Payer: Self-pay | Admitting: Obstetrics & Gynecology

## 2019-08-03 ENCOUNTER — Other Ambulatory Visit: Payer: Self-pay

## 2019-08-03 ENCOUNTER — Observation Stay: Payer: Medicaid Other

## 2019-08-03 ENCOUNTER — Observation Stay
Admission: EM | Admit: 2019-08-03 | Discharge: 2019-08-03 | Disposition: A | Discharge status: Home / Self Care | Payer: Medicaid Other | Attending: Certified Nurse Midwife | Admitting: Certified Nurse Midwife

## 2019-08-03 DIAGNOSIS — B373 Candidiasis of vulva and vagina: Secondary | ICD-10-CM | POA: Insufficient documentation

## 2019-08-03 DIAGNOSIS — O26893 Other specified pregnancy related conditions, third trimester: Secondary | ICD-10-CM | POA: Diagnosis not present

## 2019-08-03 DIAGNOSIS — O99513 Diseases of the respiratory system complicating pregnancy, third trimester: Secondary | ICD-10-CM | POA: Diagnosis not present

## 2019-08-03 DIAGNOSIS — F17210 Nicotine dependence, cigarettes, uncomplicated: Secondary | ICD-10-CM | POA: Insufficient documentation

## 2019-08-03 DIAGNOSIS — Z3A32 32 weeks gestation of pregnancy: Secondary | ICD-10-CM | POA: Insufficient documentation

## 2019-08-03 DIAGNOSIS — F1410 Cocaine abuse, uncomplicated: Secondary | ICD-10-CM

## 2019-08-03 DIAGNOSIS — O98313 Other infections with a predominantly sexual mode of transmission complicating pregnancy, third trimester: Secondary | ICD-10-CM | POA: Diagnosis not present

## 2019-08-03 DIAGNOSIS — O09523 Supervision of elderly multigravida, third trimester: Secondary | ICD-10-CM

## 2019-08-03 DIAGNOSIS — O99333 Smoking (tobacco) complicating pregnancy, third trimester: Secondary | ICD-10-CM | POA: Diagnosis not present

## 2019-08-03 DIAGNOSIS — F1490 Cocaine use, unspecified, uncomplicated: Secondary | ICD-10-CM | POA: Insufficient documentation

## 2019-08-03 DIAGNOSIS — A5901 Trichomonal vulvovaginitis: Secondary | ICD-10-CM | POA: Diagnosis not present

## 2019-08-03 DIAGNOSIS — J069 Acute upper respiratory infection, unspecified: Secondary | ICD-10-CM

## 2019-08-03 DIAGNOSIS — O98813 Other maternal infectious and parasitic diseases complicating pregnancy, third trimester: Secondary | ICD-10-CM | POA: Insufficient documentation

## 2019-08-03 DIAGNOSIS — O99323 Drug use complicating pregnancy, third trimester: Secondary | ICD-10-CM | POA: Insufficient documentation

## 2019-08-03 DIAGNOSIS — F1290 Cannabis use, unspecified, uncomplicated: Secondary | ICD-10-CM

## 2019-08-03 DIAGNOSIS — R0989 Other specified symptoms and signs involving the circulatory and respiratory systems: Secondary | ICD-10-CM

## 2019-08-03 DIAGNOSIS — O4703 False labor before 37 completed weeks of gestation, third trimester: Secondary | ICD-10-CM | POA: Diagnosis present

## 2019-08-03 DIAGNOSIS — O0933 Supervision of pregnancy with insufficient antenatal care, third trimester: Secondary | ICD-10-CM

## 2019-08-03 HISTORY — DX: Smoking (tobacco) complicating pregnancy, unspecified trimester: O99.330

## 2019-08-03 HISTORY — DX: Drug use complicating pregnancy, unspecified trimester: O99.320

## 2019-08-03 HISTORY — DX: Other psychoactive substance abuse, uncomplicated: F19.10

## 2019-08-03 HISTORY — DX: Supervision of elderly multigravida, unspecified trimester: O09.529

## 2019-08-03 LAB — COMPREHENSIVE METABOLIC PANEL
ALT: 13 U/L (ref 0–44)
AST: 24 U/L (ref 15–41)
Albumin: 2.9 g/dL — ABNORMAL LOW (ref 3.5–5.0)
Alkaline Phosphatase: 96 U/L (ref 38–126)
Anion gap: 11 (ref 5–15)
BUN: <5 mg/dL — ABNORMAL LOW (ref 6–20)
CO2: 25 mmol/L (ref 22–32)
Calcium: 8.3 mg/dL — ABNORMAL LOW (ref 8.9–10.3)
Chloride: 99 mmol/L (ref 98–111)
Creatinine, Ser: 0.53 mg/dL (ref 0.44–1.00)
GFR calc Af Amer: >60 mL/min (ref >60)
GFR calc non Af Amer: >60 mL/min (ref >60)
Glucose, Bld: 100 mg/dL — ABNORMAL HIGH (ref 70–99)
Potassium: 3.3 mmol/L — ABNORMAL LOW (ref 3.5–5.1)
Sodium: 135 mmol/L (ref 135–145)
Total Bilirubin: 0.9 mg/dL (ref 0.3–1.2)
Total Protein: 6.2 g/dL — ABNORMAL LOW (ref 6.5–8.1)

## 2019-08-03 LAB — CBC
HCT: 30 % — ABNORMAL LOW (ref 36.0–46.0)
Hemoglobin: 10.4 g/dL — ABNORMAL LOW (ref 12.0–15.0)
MCH: 32.8 pg (ref 26.0–34.0)
MCHC: 34.7 g/dL (ref 30.0–36.0)
MCV: 94.6 fL (ref 80.0–100.0)
Platelets: 254 10*3/uL (ref 150–400)
RBC: 3.17 MIL/uL — ABNORMAL LOW (ref 3.87–5.11)
RDW: 12 % (ref 11.5–15.5)
WBC: 7.4 10*3/uL (ref 4.0–10.5)
nRBC: 0 % (ref 0.0–0.2)

## 2019-08-03 LAB — URINE DRUG SCREEN, QUALITATIVE (ARMC ONLY)
Amphetamines, Ur Screen: NOT DETECTED
Barbiturates, Ur Screen: NOT DETECTED
Benzodiazepine, Ur Scrn: NOT DETECTED
Cannabinoid 50 Ng, Ur ~~LOC~~: POSITIVE — AB
Cocaine Metabolite,Ur ~~LOC~~: POSITIVE — AB
MDMA (Ecstasy)Ur Screen: NOT DETECTED
Methadone Scn, Ur: NOT DETECTED
Opiate, Ur Screen: NOT DETECTED
Phencyclidine (PCP) Ur S: NOT DETECTED
Tricyclic, Ur Screen: NOT DETECTED

## 2019-08-03 LAB — URINALYSIS, COMPLETE (UACMP) WITH MICROSCOPIC
Bacteria, UA: NONE SEEN
Bilirubin Urine: NEGATIVE
Glucose, UA: NEGATIVE mg/dL
Hgb urine dipstick: NEGATIVE
Ketones, ur: 20 mg/dL — AB
Nitrite: NEGATIVE
Protein, ur: NEGATIVE mg/dL
Specific Gravity, Urine: 1.005 (ref 1.005–1.030)
pH: 7 (ref 5.0–8.0)

## 2019-08-03 LAB — PROTEIN / CREATININE RATIO, URINE
Creatinine, Urine: 77 mg/dL
Protein Creatinine Ratio: 0.09 mg/mg{Cre} (ref 0.00–0.15)
Total Protein, Urine: 7 mg/dL

## 2019-08-03 LAB — CHLAMYDIA/NGC RT PCR (ARMC ONLY)
Chlamydia Tr: NOT DETECTED
N gonorrhoeae: NOT DETECTED

## 2019-08-03 LAB — GROUP B STREP BY PCR: Group B strep by PCR: NEGATIVE

## 2019-08-03 MED ORDER — MICONAZOLE NITRATE 2 % VA CREA: 1.0000 | TOPICAL_CREAM | Freq: Every day | VAGINAL | 0 refills | Status: DC

## 2019-08-03 MED ORDER — DIPHENHYDRAMINE HCL 25 MG PO CAPS
25.0000 mg | ORAL_CAPSULE | Freq: Four times a day (QID) | ORAL | Status: DC | PRN
  Administered 2019-08-03 (×2): 25 mg via ORAL
  Filled 2019-08-03 (×2): qty 1

## 2019-08-03 MED ORDER — DIPHENHYDRAMINE HCL 25 MG PO CAPS: 25.0000 mg | ORAL_CAPSULE | Freq: Four times a day (QID) | ORAL | 0 refills | Status: DC | PRN

## 2019-08-03 MED ORDER — MICONAZOLE NITRATE 2 % VA CREA
1.0000 | TOPICAL_CREAM | Freq: Every day | VAGINAL | Status: DC
  Administered 2019-08-03: 1 via VAGINAL
  Filled 2019-08-03: qty 45

## 2019-08-03 MED ORDER — METRONIDAZOLE 500 MG PO TABS
2000.0000 mg | ORAL_TABLET | Freq: Once | ORAL | Status: AC
  Administered 2019-08-03: 2000 mg via ORAL
  Filled 2019-08-03: qty 4

## 2019-08-03 MED ORDER — GUAIFENESIN-DM 100-10 MG/5ML PO SYRP: 10.0000 mL | ORAL_SOLUTION | ORAL | 0 refills | Status: DC | PRN

## 2019-08-03 MED ORDER — GUAIFENESIN-DM 100-10 MG/5ML PO SYRP
10.0000 mL | ORAL_SOLUTION | ORAL | Status: DC | PRN
  Administered 2019-08-03 (×2): 10 mL via ORAL
  Filled 2019-08-03 (×5): qty 10

## 2019-08-03 NOTE — Progress Notes (Signed)
Discharge instructions given to pt. Reviewed prescribed medications and instructed to come back Monday or Tuesday for a blood pressure check. Pt states she will come on Monday. Pt verbalized understanding and had no questions. Pt sister picking her up downstairs.

## 2019-08-03 NOTE — Final Progress Note (Signed)
Physician Final Progress Note  Patient ID: Paige Quintonrica M Presutti MRN: [id number] DOB/AGE: 35/11/1985 35 y.o.  Admit date: 08/03/2019 Admitting provider: Nadara Mustardobert P Harris, MD/ Gasper Lloydolleen L. 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 710 [id number] female with EDC=1/2/2021at 393w0d dated by a 22wk 6day ultrasound.  Her pregnancy has been complicated by no prenatal care, substance abuse, advanced maternal age,and she is  grand multiparous.  She presents to L&D for evaluation of preterm contractions. Her contractions are every 8 minutes x 2 hours and she rates them 9/10. She has had cough, congestion, postnasal drainage x 4 days. She states she feels dehydrated. Has not taken any medication for the URI symptoms. Last intercourse 48 hours ago. Denies bleeding or leakage of fluid. Denies dysuria, vulvar itching or irritation. .   Prenatal care site: NO prenatal care. Was seen in L&D in September for abdominal pain and had a dating ultrasound at that time. Does not have custody of any of her children. Lives alone.  Maternal Medical History:   Past Medical History:  Diagnosis Date  . Advanced maternal age in multigravida   . Substance abuse affecting pregnancy, antepartum    cocaine, marijuana  . Tobacco use affecting pregnancy, antepartum     Past Surgical History:  Procedure Laterality Date  . NO PAST SURGERIES      No Known Allergies  Current medications: none   Social History: She  reports that she has been smoking cigarettes. She has been smoking about 0.25 packs per  day. She has never used smokeless tobacco. She reports current alcohol use of about 1.0 standard drinks of alcohol per week. She reports current drug use. Drugs: Marijuana and Cocaine.  Family History: family history includes Cancer in her mother.   Review of Systems: Negative x 10 systems reviewed except as noted in the HPI.      Physical Exam:  Vital Signs: BP (!) 141/83   Pulse 93   Temp 98 F (36.7 C) (Oral)   Resp 16   Ht 5\' 4"  (1.626 m)   Wt 57.6 kg   BMI 21.80 kg/m   No data found. 135/82, 143/76, 137/72, 133/69 General: gravid WF, coughing, appears chilled  HEENT: normocephalic, atraumatic Neck: no cervical lymphadenopathy,  Heart: regular rate & rhythm.  No murmurs/rubs/gallops Lungs: expiratory rhonchi right upper lobe, left lung CTA Abdomen: soft, gravid, non-tender;   Pelvic:   External: Normal external female genitalia  Vagina: thin milky vaginal discharge  Cervix: Long/closed/OOP  Wet [place]: - clue cells; + trichomonas;  + yeast   Extremities: non-tender, symmetric, no edema bilaterally.   Neurologic: Alert & oriented x 3.    Pertinent Results:  Prenatal Labs: Blood type/Rh AB positive  Antibody screen   Rubella immune  RPR Non reactive  HBsAg negative  HIV   GC negative  Chlamydia negative  Genetic screening   1 hour GTT   3 hour GTT   GBS negative on 08/03/19   Baseline FHR: 135-140 with accelerations to 150s to 160, moderate variability Toco: 2 or 3 contractions in the first hour, then the contractions became rare  with po hydration  rinalysis, Complete w Microscopic     Status: Abnormal   Collection Time: 08/03/19  7:49 PM  Result Value Ref Range   Color, Urine YELLOW (A) YELLOW   APPearance HAZY (A) CLEAR   Specific Gravity, Urine 1.005 1.005 - 1.030   pH 7.0 5.0 - 8.0   Glucose, UA NEGATIVE NEGATIVE mg/dL   Hgb urine dipstick NEGATIVE NEGATIVE   Bilirubin Urine NEGATIVE NEGATIVE   Ketones, ur 20 (A) NEGATIVE mg/dL   Protein, ur  NEGATIVE NEGATIVE mg/dL   Nitrite NEGATIVE NEGATIVE   Leukocytes,Ua TRACE (A) NEGATIVE   RBC / HPF 0-5 0 - 5 RBC/hpf   WBC, UA 0-5 0 - 5 WBC/hpf   Bacteria, UA NONE SEEN NONE SEEN   Squamous Epithelial / LPF 21-50 0 - 5    Comment: Performed at [facility], [address], [place], [place] [address]  Urine Drug Screen, Qualitative ([facility] only)     Status: Abnormal   Collection Time: 08/03/19  7:49 PM  Result Value Ref Range   Tricyclic, Ur Screen NONE DETECTED NONE DETECTED   Amphetamines, Ur Screen NONE DETECTED NONE DETECTED   MDMA (Ecstasy)Ur Screen NONE DETECTED NONE DETECTED   Cocaine Metabolite,Ur [place] POSITIVE (A) NONE DETECTED   Opiate, Ur Screen NONE DETECTED NONE DETECTED   Phencyclidine (PCP) Ur S NONE DETECTED NONE DETECTED   Cannabinoid 50 Ng, Ur [place] POSITIVE (A) NONE DETECTED   Barbiturates, Ur Screen NONE DETECTED NONE DETECTED   Benzodiazepine, Ur Scrn NONE DETECTED NONE DETECTED   Methadone Scn, Ur NONE DETECTED NONE DETECTED    Comment: (NOTE) Tricyclics + metabolites, urine    Cutoff 1000 ng/mL Amphetamines + metabolites, urine  Cutoff 1000 ng/mL MDMA (Ecstasy), urine              Cutoff 500 ng/mL Cocaine Metabolite, urine          Cutoff 300 ng/mL Opiate + metabolites, urine        Cutoff 300 ng/mL Phencyclidine (PCP), urine         Cutoff 25 ng/mL Cannabinoid, urine                 Cutoff 50 ng/mL Barbiturates + metabolites, urine  Cutoff 200 ng/mL Benzodiazepine, urine              Cutoff 200 ng/mL Methadone, urine                   Cutoff 300 ng/mL The urine drug screen provides only a preliminary, unconfirmed analytical test result and should not be used for non-medical purposes. Clinical consideration and professional judgment should be applied to any positive drug screen result due to possible interfering substances. A more specific alternate chemical method must be used in order to obtain a confirmed analytical result. Gas chromatography / mass  spectrometry (GC/MS) is the preferred confirmat ory method. Performed at [facility], [address] [place], [place] [address]   Chlamydia/NGC rt PCR [facility] only)     Status: None   Collection Time: 08/03/19  7:49 PM   Specimen: Cervical/Vaginal swab; Genital  Result Value Ref Range   Specimen source GC/Chlam ENDOCERVICAL    Chlamydia Tr NOT DETECTED NOT DETECTED   N gonorrhoeae NOT DETECTED NOT DETECTED    Comment: (NOTE) This CT/NG assay has not been evaluated in patients with a history of  hysterectomy. Performed at [facility], [address], [place], [place] [address]  Group B strep by PCR     Status: None   Collection Time: 08/03/19  7:49 PM   Specimen: Vaginal/Rectal; Genital  Result Value Ref Range   Group B strep by PCR NEGATIVE NEGATIVE    Comment: (NOTE) Intrapartum testing with Xpert GBS assay should be used as an adjunct to other methods available and not used to replace antepartum testing (at 35-[redacted] weeks gestation). Performed at [facility], [address] [place], [place] [address]   Protein / creatinine ratio, urine     Status: None   Collection Time: 08/03/19  7:49 PM  Result Value Ref Range   Creatinine, Urine 77 mg/dL   Total Protein, Urine 7 mg/dL    Comment: NO NORMAL RANGE ESTABLISHED FOR THIS TEST   Protein Creatinine Ratio 0.09 0.00 - 0.15 mg/mg[Cre]    Comment: Performed at [facility], [address] [place], [place] [address]  CBC     Status: Abnormal   Collection Time: 08/03/19  8:33 PM  Result Value Ref Range   WBC 7.4 4.0 - 10.5 K/uL   RBC 3.17 (L) 3.87 - 5.11 MIL/uL   Hemoglobin 10.4 (L) 12.0 - 15.0 g/dL   HCT 29.5 (L) 62.1 - 30.8 %   MCV 94.6 80.0 - 100.0 fL   MCH 32.8 26.0 - 34.0 pg   MCHC 34.7 30.0 - 36.0 g/dL   RDW 65.7 84.6 - 96.2 %   Platelets 254 150 - 400 K/uL   nRBC 0.0 0.0 - 0.2 %    Comment: Performed at [facility], [address] [place], [place] [address]   Comprehensive metabolic panel     Status: Abnormal   Collection Time: 08/03/19  8:33 PM  Result Value Ref Range   Sodium 135 135 - 145 mmol/L   Potassium 3.3 (L) 3.5 - 5.1 mmol/L   Chloride 99 98 - 111 mmol/L   CO2 25 22 - 32 mmol/L   Glucose, Bld 100 (H) 70 - 99 mg/dL   BUN <5 (L) 6 - 20 mg/dL   Creatinine, Ser 1.32 0.44 - 1.00 mg/dL   Calcium 8.3 (L) 8.9 - 10.3 mg/dL   Total Protein 6.2 (L) 6.5 - 8.1 g/dL   Albumin 2.9 (L) 3.5 - 5.0 g/dL   AST 24 15 - 41 U/L   ALT 13 0 - 44 U/L   Alkaline Phosphatase 96 38 - 126 U/L   Total Bilirubin 0.9 0.3 - 1.2 mg/dL   GFR calc non Af Amer >60 >60 mL/min   GFR calc Af Amer >60 >60 mL/min   Anion gap 11 5 - 15    Comment: Performed at [facility], [address], [place], [place] [address]   Chest Xray was negative Assessment:  ZARAYAH LANTING is a 33 y.o. G59P7 female at [redacted]w[redacted]d with no evidence of preterm labor  Elevated blood pressures (mild range) No evidence of preeclampsia  CHTN vs gestational hypertension  Recommend returning to L&D in 2-3 days for blood pressure check  If returns to L&D, consider growth scan Trichimoniasis-treated with Flagyl 2 GM Monilial vulvovaginitis-Monistat 7 cream No prenatal care Cocaine and marijuana use URI -Benadryl, Tylenol, Robitussin DM  The patient requested discharge as her ride was coming for her She declined Covid 19 testing  Procedures: Chest Xray  Discharge Condition: stable  Disposition: Discharge disposition: 01-Home or Self Care       Diet: Regular diet  Discharge Activity: Activity as tolerated  Discharge Instructions  Discharge patient   Complete by: As directed    Discharge disposition: 01-Home or Self Care   Discharge patient date: 08/03/2019     Allergies as of 08/03/2019   No Known Allergies     Medication List    STOP taking these medications   promethazine 25 MG tablet Commonly known as: PHENERGAN     TAKE these medications   diphenhydrAMINE 25  mg capsule Commonly known as: BENADRYL Take 1 capsule (25 mg total) by mouth every 6 (six) hours as needed (congestion).   guaiFENesin-dextromethorphan 100-10 MG/5ML syrup Commonly known as: ROBITUSSIN DM Take 10 mLs by mouth every 4 (four) hours as needed for cough.   miconazole 2 % vaginal cream Commonly known as: MONISTAT 7 Place 1 Applicatorful vaginally at bedtime.   multivitamin-prenatal 27-0.8 MG Tabs tablet Take 1 tablet by mouth daily at 12 noon.   potassium chloride SA 20 MEQ tablet Commonly known as: KLOR-CON Take 2 tablets (40 mEq total) by mouth once for 1 dose.      Follow-up Information    [facility]. Go to.   Specialty: Obstetrics and Gynecology Why: appointment for blood pressure check in 3-4 days Contact information: [id number] [place] [address] [phone]          Total time spent taking care of this patient: 30 minutes  Signed: Dalia Heading 08/06/2019, 8:26 PM

## 2019-08-03 NOTE — OB Triage Note (Signed)
Pt presents c/o ctx that started 2 hours ago. Pt states they are about 8 minutes apart. Pt rates them 9/10 on pain scale. Pt states she has a cold and feels dehydrated. Last intercourse was 48 hours ago. Pt has a cough. Denies bleeding or LOF. Reports positive fetal movement. Vitals WNL. Will continue to monitor.

## 2019-08-03 NOTE — Discharge Instructions (Signed)
Come back for a blood pressure check on Monday or Tuesday. Take prescriptions as prescribed.  Stay well hydrated and get plenty of rest.    LABOR: When contractions begin, you should start to time them from the beginning of one contraction to the beginning of the next.  When contractions are 5-10 minutes apart or less and have been regular for at least an hour, you should call your health care provider.  Notify your doctor if any of the following occur: 1. Bleeding from the vagina 7. Sudden, constant, or occasional abdominal pain  2. Pain or burning when urinating 8. Sudden gushing of fluid from the vagina (with or without continued leaking)  3. Chills or fever 9. Fainting spells, "black outs" or loss of consciousness  4. Increase in vaginal discharge 10. Severe or continued nausea or vomiting  5. Pelvic pressure (sudden increase) 11. Blurring of vision or spots before the eyes  6. Baby moving less than usual 12. Leaking of fluid    FETAL KICK COUNT: Lie on your left side for one hour after a meal, and count the number of times your baby kicks. If it is less than 5 times, get up, move around and drink some juice. Repeat the test 30 minutes later. If it is still less than 5 kicks in an hour, notify your doctor.

## 2019-08-06 ENCOUNTER — Encounter: Payer: Self-pay | Admitting: Certified Nurse Midwife

## 2019-08-27 ENCOUNTER — Other Ambulatory Visit: Payer: Self-pay

## 2019-08-27 ENCOUNTER — Observation Stay
Admission: EM | Admit: 2019-08-27 | Discharge: 2019-08-27 | Disposition: A | Discharge status: Home / Self Care | Payer: Medicaid Other | Attending: Obstetrics and Gynecology | Admitting: Obstetrics and Gynecology

## 2019-08-27 DIAGNOSIS — M545 Low back pain: Secondary | ICD-10-CM | POA: Insufficient documentation

## 2019-08-27 DIAGNOSIS — F1410 Cocaine abuse, uncomplicated: Secondary | ICD-10-CM | POA: Insufficient documentation

## 2019-08-27 DIAGNOSIS — R030 Elevated blood-pressure reading, without diagnosis of hypertension: Secondary | ICD-10-CM | POA: Diagnosis not present

## 2019-08-27 DIAGNOSIS — O26893 Other specified pregnancy related conditions, third trimester: Secondary | ICD-10-CM | POA: Diagnosis present

## 2019-08-27 DIAGNOSIS — O99323 Drug use complicating pregnancy, third trimester: Secondary | ICD-10-CM | POA: Insufficient documentation

## 2019-08-27 DIAGNOSIS — Z3A35 35 weeks gestation of pregnancy: Secondary | ICD-10-CM | POA: Diagnosis not present

## 2019-08-27 DIAGNOSIS — M549 Dorsalgia, unspecified: Secondary | ICD-10-CM | POA: Diagnosis present

## 2019-08-27 DIAGNOSIS — F1490 Cocaine use, unspecified, uncomplicated: Secondary | ICD-10-CM

## 2019-08-27 LAB — TYPE AND SCREEN
ABO/RH(D): AB POS
Antibody Screen: NEGATIVE

## 2019-08-27 LAB — COMPREHENSIVE METABOLIC PANEL
ALT: 15 U/L (ref 0–44)
AST: 24 U/L (ref 15–41)
Albumin: 3.3 g/dL — ABNORMAL LOW (ref 3.5–5.0)
Alkaline Phosphatase: 162 U/L — ABNORMAL HIGH (ref 38–126)
Anion gap: 11 (ref 5–15)
BUN: <5 mg/dL — ABNORMAL LOW (ref 6–20)
CO2: 24 mmol/L (ref 22–32)
Calcium: 8.4 mg/dL — ABNORMAL LOW (ref 8.9–10.3)
Chloride: 100 mmol/L (ref 98–111)
Creatinine, Ser: 0.5 mg/dL (ref 0.44–1.00)
GFR calc Af Amer: >60 mL/min (ref >60)
GFR calc non Af Amer: >60 mL/min (ref >60)
Glucose, Bld: 76 mg/dL (ref 70–99)
Potassium: 3.2 mmol/L — ABNORMAL LOW (ref 3.5–5.1)
Sodium: 135 mmol/L (ref 135–145)
Total Bilirubin: 1.3 mg/dL — ABNORMAL HIGH (ref 0.3–1.2)
Total Protein: 6.9 g/dL (ref 6.5–8.1)

## 2019-08-27 LAB — URINALYSIS, ROUTINE W REFLEX MICROSCOPIC
Bilirubin Urine: NEGATIVE
Glucose, UA: NEGATIVE mg/dL
Hgb urine dipstick: NEGATIVE
Ketones, ur: 5 mg/dL — AB
Nitrite: NEGATIVE
Protein, ur: NEGATIVE mg/dL
Specific Gravity, Urine: 1.006 (ref 1.005–1.030)
pH: 7 (ref 5.0–8.0)

## 2019-08-27 LAB — PROTEIN / CREATININE RATIO, URINE
Creatinine, Urine: 68 mg/dL
Protein Creatinine Ratio: 0.12 mg/mg{Cre} (ref 0.00–0.15)
Total Protein, Urine: 8 mg/dL

## 2019-08-27 LAB — CBC
HCT: 33.7 % — ABNORMAL LOW (ref 36.0–46.0)
Hemoglobin: 11.6 g/dL — ABNORMAL LOW (ref 12.0–15.0)
MCH: 32.8 pg (ref 26.0–34.0)
MCHC: 34.4 g/dL (ref 30.0–36.0)
MCV: 95.2 fL (ref 80.0–100.0)
Platelets: 258 10*3/uL (ref 150–400)
RBC: 3.54 MIL/uL — ABNORMAL LOW (ref 3.87–5.11)
RDW: 12.2 % (ref 11.5–15.5)
WBC: 8.5 10*3/uL (ref 4.0–10.5)
nRBC: 0 % (ref 0.0–0.2)

## 2019-08-27 LAB — URINE DRUG SCREEN, QUALITATIVE (ARMC ONLY)
Amphetamines, Ur Screen: NOT DETECTED
Barbiturates, Ur Screen: NOT DETECTED
Benzodiazepine, Ur Scrn: NOT DETECTED
Cannabinoid 50 Ng, Ur ~~LOC~~: POSITIVE — AB
Cocaine Metabolite,Ur ~~LOC~~: POSITIVE — AB
MDMA (Ecstasy)Ur Screen: NOT DETECTED
Methadone Scn, Ur: NOT DETECTED
Opiate, Ur Screen: NOT DETECTED
Phencyclidine (PCP) Ur S: NOT DETECTED
Tricyclic, Ur Screen: NOT DETECTED

## 2019-08-27 LAB — HEPATITIS B SURFACE ANTIGEN: Hepatitis B Surface Ag: NONREACTIVE

## 2019-08-27 LAB — HIV ANTIBODY (ROUTINE TESTING W REFLEX): HIV Screen 4th Generation wRfx: NONREACTIVE

## 2019-08-27 MED ORDER — ACETAMINOPHEN 325 MG PO TABS
650.0000 mg | ORAL_TABLET | ORAL | Status: DC | PRN
  Administered 2019-08-27: 650 mg via ORAL
  Filled 2019-08-27: qty 2

## 2019-08-27 NOTE — OB Triage Note (Signed)
Pt arrival to triage with c/o back pain for the last 2 days, described as sharp bilateral lower back pain, rating it as 10/10 back pain.  Pt denies vaginal bleeding and LOF, but states some vaginal discharge noticed.  Pt is feeling baby move normally.  Pt states she is also having some braxton hicks contractions.  EFM and toco applied and assessing.

## 2019-08-27 NOTE — Discharge Summary (Signed)
Physician Final Progress Note  Patient ID: Paige Pierce MRN: [id number] DOB/AGE: 35/11/1985 33 y.o.  Admit date: 08/27/2019 Admitting provider: Vena AustriaAndreas Leonarda Leis, MD Discharge date: 08/27/2019   Admission Diagnoses: Back pain in pregnancy  Discharge Diagnoses:  Active Problems:   Back pain affecting pregnancy  33 y.o. [id number] at 519w3d with no prenatal care presenting with lumbago.  +FM, no LOF, no VB, no ctx.  Denies dysuria, fevers, or chills.  No trauma or inciting event.  Pain is located over lower back.  It is reproducible on palpation.    BP elevated on presentation.  Patient with history of chronic cocaine abuse and positive on admission.  No HA or vision changes.  We discussed implication of continued cocaine use in pregnancy.    Discussed conservative means of lumbago treatment, localized application of heat, topical lineament creams, and pregnancy support belts.  Blood pressure (!) 140/93, pulse 72, temperature 98 F (36.7 C), temperature source Oral, height 5\' 4"  (1.626 m), weight 61.2 kg, unknown if currently breastfeeding.  Temp:  [98 F (36.7 C)] 98 F (36.7 C) (12/01 1926) Pulse Rate:  [71-85] 72 (12/01 2045) BP: (126-144)/(80-93) 140/93 (12/01 2045) Weight:  [61.2 kg] 61.2 kg (12/01 1924)   Consults: None  Significant Findings/ Diagnostic Studies:  Results for orders placed or performed during the hospital encounter of 08/27/19 (from the past 24 hour(s))  Comprehensive metabolic panel     Status: Abnormal   Collection Time: 08/27/19  8:14 PM  Result Value Ref Range   Sodium 135 135 - 145 mmol/L   Potassium 3.2 (L) 3.5 - 5.1 mmol/L   Chloride 100 98 - 111 mmol/L   CO2 24 22 - 32 mmol/L   Glucose, Bld 76 70 - 99 mg/dL   BUN <5 (L) 6 - 20 mg/dL   Creatinine, Ser 9.620.50 0.44 - 1.00 mg/dL   Calcium 8.4 (L) 8.9 - 10.3 mg/dL   Total Protein 6.9 6.5 - 8.1 g/dL   Albumin 3.3 (L) 3.5 - 5.0 g/dL   AST 24 15 - 41 U/L   ALT 15 0 - 44 U/L   Alkaline Phosphatase 162  (H) 38 - 126 U/L   Total Bilirubin 1.3 (H) 0.3 - 1.2 mg/dL   GFR calc non Af Amer >60 >60 mL/min   GFR calc Af Amer >60 >60 mL/min   Anion gap 11 5 - 15  CBC on admission     Status: Abnormal   Collection Time: 08/27/19  8:14 PM  Result Value Ref Range   WBC 8.5 4.0 - 10.5 K/uL   RBC 3.54 (L) 3.87 - 5.11 MIL/uL   Hemoglobin 11.6 (L) 12.0 - 15.0 g/dL   HCT 95.233.7 (L) 84.136.0 - 32.446.0 %   MCV 95.2 80.0 - 100.0 fL   MCH 32.8 26.0 - 34.0 pg   MCHC 34.4 30.0 - 36.0 g/dL   RDW 40.112.2 02.711.5 - 25.315.5 %   Platelets 258 150 - 400 K/uL   nRBC 0.0 0.0 - 0.2 %  Type and screen [facility]     Status: None (Preliminary result)   Collection Time: 08/27/19  8:14 PM  Result Value Ref Range   ABO/RH(D) PENDING    Antibody Screen NEG    Sample Expiration      08/30/2019,2359 Performed at [facility], [address] [place], [place] [id number]   Urinalysis, Routine w reflex microscopic     Status: Abnormal   Collection Time: 08/27/19  8:18 PM  Result Value Ref Range   Color, Urine YELLOW (A) YELLOW   APPearance CLOUDY (A) CLEAR   Specific Gravity, Urine 1.006 1.005 - 1.030   pH 7.0 5.0 - 8.0   Glucose, UA NEGATIVE NEGATIVE mg/dL   Hgb urine dipstick NEGATIVE NEGATIVE   Bilirubin Urine NEGATIVE NEGATIVE   Ketones, ur 5 (A) NEGATIVE mg/dL   Protein, ur NEGATIVE NEGATIVE mg/dL   Nitrite NEGATIVE NEGATIVE   Leukocytes,Ua LARGE (A) NEGATIVE   RBC / HPF 0-5 0 - 5 RBC/hpf   WBC, UA 21-50 0 - 5 WBC/hpf   Bacteria, UA RARE (A) NONE SEEN   Squamous Epithelial / LPF 21-50 0 - 5   Mucus PRESENT   Protein / creatinine ratio, urine     Status: None   Collection Time: 08/27/19  8:18 PM  Result Value Ref Range   Creatinine, Urine 68 mg/dL   Total Protein, Urine 8 mg/dL   Protein Creatinine Ratio 0.12 0.00 - 0.15 mg/mg[Cre]  Urine Drug Screen, Qualitative ([facility] only)     Status: Abnormal   Collection Time: 08/27/19  8:18 PM  Result Value Ref Range   Tricyclic, Ur Screen NONE  DETECTED NONE DETECTED   Amphetamines, Ur Screen NONE DETECTED NONE DETECTED   MDMA (Ecstasy)Ur Screen NONE DETECTED NONE DETECTED   Cocaine Metabolite,Ur [place] POSITIVE (A) NONE DETECTED   Opiate, Ur Screen NONE DETECTED NONE DETECTED   Phencyclidine (PCP) Ur S NONE DETECTED NONE DETECTED   Cannabinoid 50 Ng, Ur [place] POSITIVE (A) NONE DETECTED   Barbiturates, Ur Screen NONE DETECTED NONE DETECTED   Benzodiazepine, Ur Scrn NONE DETECTED NONE DETECTED   Methadone Scn, Ur NONE DETECTED NONE DETECTED     Procedures: NST  Baseline: 140 Variability: moderate Accelerations: present Decelerations: absent Tocometry: irregular The patient was monitored for 30 minutes, fetal heart rate tracing was deemed reactive, category I tracing,  Discharge Condition: good  Disposition: Discharge disposition: 01-Home or Self Care       Diet: Regular diet  Discharge Activity: Activity as tolerated  Discharge Instructions    Discharge activity:  No Restrictions   Complete by: As directed    Discharge diet:  No restrictions   Complete by: As directed    Fetal Kick Count:  Lie on our left side for one hour after a meal, and count the number of times your baby kicks.  If it is less than 5 times, get up, move around and drink some juice.  Repeat the test 30 minutes later.  If it is still less than 5 kicks in an hour, notify your doctor.   Complete by: As directed    LABOR:  When conractions begin, you should start to time them from the beginning of one contraction to the beginning  of the next.  When contractions are 5 - 10 minutes apart or less and have been regular for at least an hour, you should call your health care provider.   Complete by: As directed    No sexual activity restrictions   Complete by: As directed    Notify physician for bleeding from the vagina   Complete by: As directed    Notify physician for blurring of vision or spots before the eyes   Complete by: As directed    Notify  physician for chills or fever   Complete by: As directed    Notify physician for fainting spells, "black outs" or loss of consciousness   Complete by: As directed  Notify physician for increase in vaginal discharge   Complete by: As directed    Notify physician for leaking of fluid   Complete by: As directed    Notify physician for pain or burning when urinating   Complete by: As directed    Notify physician for pelvic pressure (sudden increase)   Complete by: As directed    Notify physician for severe or continued nausea or vomiting   Complete by: As directed    Notify physician for sudden gushing of fluid from the vagina (with or without continued leaking)   Complete by: As directed    Notify physician for sudden, constant, or occasional abdominal pain   Complete by: As directed    Notify physician if baby moving less than usual   Complete by: As directed      Allergies as of 08/27/2019   No Known Allergies     Medication List    STOP taking these medications   diphenhydrAMINE 25 mg capsule Commonly known as: BENADRYL   guaiFENesin-dextromethorphan 100-10 MG/5ML syrup Commonly known as: ROBITUSSIN DM   miconazole 2 % vaginal cream Commonly known as: MONISTAT 7     TAKE these medications   multivitamin-prenatal 27-0.8 MG Tabs tablet Take 1 tablet by mouth daily at 12 noon.   potassium chloride SA 20 MEQ tablet Commonly known as: KLOR-CON Take 2 tablets (40 mEq total) by mouth once for 1 dose.      Follow-up Information    [facility]. Schedule an appointment as soon as possible for a visit in 1 week(s).   Contact information: [address] [place] [address] [phone]          Total time spent taking care of this patient: 40 minutes  Signed: Malachy Mood 08/27/2019, 9:22 PM

## 2019-08-28 LAB — RPR: RPR Ser Ql: NONREACTIVE

## 2019-08-29 LAB — RUBELLA SCREEN: Rubella: 1.04 index (ref >0.99)

## 2019-09-07 ENCOUNTER — Encounter: Payer: Self-pay | Admitting: Anesthesiology

## 2019-09-07 ENCOUNTER — Inpatient Hospital Stay
Admission: EM | Admit: 2019-09-07 | Discharge: 2019-09-11 | DRG: 806 | Discharge status: Against Medical Advice | Payer: Medicaid Other | Attending: Obstetrics and Gynecology | Admitting: Obstetrics and Gynecology

## 2019-09-07 ENCOUNTER — Other Ambulatory Visit: Payer: Self-pay

## 2019-09-07 ENCOUNTER — Encounter: Payer: Self-pay | Admitting: Obstetrics and Gynecology

## 2019-09-07 DIAGNOSIS — O9081 Anemia of the puerperium: Secondary | ICD-10-CM | POA: Diagnosis not present

## 2019-09-07 DIAGNOSIS — O1410 Severe pre-eclampsia, unspecified trimester: Secondary | ICD-10-CM | POA: Diagnosis not present

## 2019-09-07 DIAGNOSIS — O1415 Severe pre-eclampsia, complicating the puerperium: Secondary | ICD-10-CM | POA: Diagnosis not present

## 2019-09-07 DIAGNOSIS — Z3A37 37 weeks gestation of pregnancy: Secondary | ICD-10-CM

## 2019-09-07 DIAGNOSIS — O4290 Premature rupture of membranes, unspecified as to length of time between rupture and onset of labor, unspecified weeks of gestation: Secondary | ICD-10-CM | POA: Diagnosis present

## 2019-09-07 DIAGNOSIS — F1410 Cocaine abuse, uncomplicated: Secondary | ICD-10-CM | POA: Diagnosis present

## 2019-09-07 DIAGNOSIS — O26893 Other specified pregnancy related conditions, third trimester: Secondary | ICD-10-CM | POA: Diagnosis present

## 2019-09-07 DIAGNOSIS — D62 Acute posthemorrhagic anemia: Secondary | ICD-10-CM | POA: Diagnosis not present

## 2019-09-07 DIAGNOSIS — O99334 Smoking (tobacco) complicating childbirth: Secondary | ICD-10-CM | POA: Diagnosis present

## 2019-09-07 DIAGNOSIS — F17210 Nicotine dependence, cigarettes, uncomplicated: Secondary | ICD-10-CM | POA: Diagnosis present

## 2019-09-07 DIAGNOSIS — O99324 Drug use complicating childbirth: Secondary | ICD-10-CM | POA: Diagnosis present

## 2019-09-07 DIAGNOSIS — Z20828 Contact with and (suspected) exposure to other viral communicable diseases: Secondary | ICD-10-CM | POA: Diagnosis present

## 2019-09-07 DIAGNOSIS — O4202 Full-term premature rupture of membranes, onset of labor within 24 hours of rupture: Secondary | ICD-10-CM | POA: Diagnosis not present

## 2019-09-07 LAB — URINE DRUG SCREEN, QUALITATIVE (ARMC ONLY)
Amphetamines, Ur Screen: NOT DETECTED
Barbiturates, Ur Screen: NOT DETECTED
Benzodiazepine, Ur Scrn: NOT DETECTED
Cannabinoid 50 Ng, Ur ~~LOC~~: POSITIVE — AB
Cocaine Metabolite,Ur ~~LOC~~: POSITIVE — AB
MDMA (Ecstasy)Ur Screen: NOT DETECTED
Methadone Scn, Ur: NOT DETECTED
Opiate, Ur Screen: NOT DETECTED
Phencyclidine (PCP) Ur S: NOT DETECTED
Tricyclic, Ur Screen: NOT DETECTED

## 2019-09-07 LAB — BASIC METABOLIC PANEL
Anion gap: 9 (ref 5–15)
BUN: <5 mg/dL — ABNORMAL LOW (ref 6–20)
CO2: 23 mmol/L (ref 22–32)
Calcium: 7.9 mg/dL — ABNORMAL LOW (ref 8.9–10.3)
Chloride: 102 mmol/L (ref 98–111)
Creatinine, Ser: 0.39 mg/dL — ABNORMAL LOW (ref 0.44–1.00)
GFR calc Af Amer: >60 mL/min (ref >60)
GFR calc non Af Amer: >60 mL/min (ref >60)
Glucose, Bld: 76 mg/dL (ref 70–99)
Potassium: 3.1 mmol/L — ABNORMAL LOW (ref 3.5–5.1)
Sodium: 134 mmol/L — ABNORMAL LOW (ref 135–145)

## 2019-09-07 LAB — CBC
HCT: 29.3 % — ABNORMAL LOW (ref 36.0–46.0)
Hemoglobin: 10.1 g/dL — ABNORMAL LOW (ref 12.0–15.0)
MCH: 31.9 pg (ref 26.0–34.0)
MCHC: 34.5 g/dL (ref 30.0–36.0)
MCV: 92.4 fL (ref 80.0–100.0)
Platelets: 266 10*3/uL (ref 150–400)
RBC: 3.17 MIL/uL — ABNORMAL LOW (ref 3.87–5.11)
RDW: 12.1 % (ref 11.5–15.5)
WBC: 6.9 10*3/uL (ref 4.0–10.5)
nRBC: 0 % (ref 0.0–0.2)

## 2019-09-07 LAB — GROUP B STREP BY PCR: Group B strep by PCR: NEGATIVE

## 2019-09-07 LAB — TYPE AND SCREEN
ABO/RH(D): AB POS
Antibody Screen: NEGATIVE

## 2019-09-07 LAB — RESPIRATORY PANEL BY RT PCR (FLU A&B, COVID)
Influenza A by PCR: NEGATIVE
Influenza B by PCR: NEGATIVE
SARS Coronavirus 2 by RT PCR: NEGATIVE

## 2019-09-07 LAB — CHLAMYDIA/NGC RT PCR (ARMC ONLY)
Chlamydia Tr: NOT DETECTED
N gonorrhoeae: NOT DETECTED

## 2019-09-07 LAB — PROTEIN / CREATININE RATIO, URINE
Creatinine, Urine: 40 mg/dL
Total Protein, Urine: <6 mg/dL

## 2019-09-07 MED ORDER — SODIUM CHLORIDE 0.9 % IV SOLN
5.0000 10*6.[IU] | Freq: Once | INTRAVENOUS | Status: AC
  Administered 2019-09-07: 5 10*6.[IU] via INTRAVENOUS
  Filled 2019-09-07: qty 5

## 2019-09-07 MED ORDER — SOD CITRATE-CITRIC ACID 500-334 MG/5ML PO SOLN: 30.0000 mL | ORAL | Status: DC | PRN

## 2019-09-07 MED ORDER — LIDOCAINE HCL (PF) 1 % IJ SOLN
INTRAMUSCULAR | Status: AC
  Filled 2019-09-07: qty 30

## 2019-09-07 MED ORDER — PENICILLIN G 3 MILLION UNITS IVPB - SIMPLE MED
3.0000 10*6.[IU] | INTRAVENOUS | Status: DC
  Filled 2019-09-07 (×5): qty 100

## 2019-09-07 MED ORDER — ACETAMINOPHEN 325 MG PO TABS: 650.0000 mg | ORAL_TABLET | ORAL | Status: DC | PRN

## 2019-09-07 MED ORDER — ONDANSETRON HCL 4 MG/2ML IJ SOLN: 4.0000 mg | Freq: Four times a day (QID) | INTRAMUSCULAR | Status: DC | PRN

## 2019-09-07 MED ORDER — OXYTOCIN 10 UNIT/ML IJ SOLN
INTRAMUSCULAR | Status: AC
  Filled 2019-09-07: qty 2

## 2019-09-07 MED ORDER — FENTANYL CITRATE (PF) 100 MCG/2ML IJ SOLN
50.0000 ug | INTRAMUSCULAR | Status: DC | PRN
  Administered 2019-09-08: 50 ug via INTRAVENOUS
  Administered 2019-09-08: 100 ug via INTRAVENOUS
  Filled 2019-09-07 (×2): qty 2

## 2019-09-07 MED ORDER — LIDOCAINE HCL (PF) 1 % IJ SOLN: 30.0000 mL | INTRAMUSCULAR | Status: DC | PRN

## 2019-09-07 MED ORDER — OXYTOCIN BOLUS FROM INFUSION: 500.0000 mL | Freq: Once | INTRAVENOUS | Status: DC

## 2019-09-07 MED ORDER — MISOPROSTOL 200 MCG PO TABS
ORAL_TABLET | ORAL | Status: AC
  Filled 2019-09-07: qty 4

## 2019-09-07 MED ORDER — OXYTOCIN 40 UNITS IN NORMAL SALINE INFUSION - SIMPLE MED
2.5000 [IU]/h | INTRAVENOUS | Status: DC
  Filled 2019-09-07: qty 1000

## 2019-09-07 MED ORDER — AMMONIA AROMATIC IN INHA
RESPIRATORY_TRACT | Status: AC
  Filled 2019-09-07: qty 10

## 2019-09-07 MED ORDER — LACTATED RINGERS IV SOLN: 500.0000 mL | INTRAVENOUS | Status: DC | PRN

## 2019-09-07 MED ORDER — LACTATED RINGERS IV SOLN
INTRAVENOUS | Status: DC
  Administered 2019-09-07 – 2019-09-08 (×3): via INTRAVENOUS

## 2019-09-07 MED ORDER — OXYTOCIN 40 UNITS IN NORMAL SALINE INFUSION - SIMPLE MED
1.0000 m[IU]/min | INTRAVENOUS | Status: DC
  Administered 2019-09-07: 2 m[IU]/min via INTRAVENOUS
  Filled 2019-09-07: qty 1000

## 2019-09-07 MED ORDER — TERBUTALINE SULFATE 1 MG/ML IJ SOLN: 0.2500 mg | Freq: Once | INTRAMUSCULAR | Status: DC | PRN

## 2019-09-07 NOTE — H&P (Signed)
Obstetric H&P   Chief Complaint: Leaking fluid  Prenatal Care Provider: No PNC  History of Present Illness: 33 y.o. [id number] by 09/28/2019, by Ultrasound presenting to L&D with no prenatal care other than labor and delivery triage visits, presenting grossly ruptured with meconium.  Her past medical history is also notable for cocaine abuse.  She is mild hypertensive on presentation today.  She has previously been offered referral to [facility] by myself but declined.  She states last cocaine use was approximately a week ago.  +FM, some contractions, no vaginal bleeding.     PNL obtained by me at time of last admission AB pos / ABSC neg / RI / HBsAg neg / RPR NR / HIV negative / GBS unknown  Review of Systems: 10 point review of systems negative unless otherwise noted in HPI  Past Medical History: Past Medical History:  Diagnosis Date  . Advanced maternal age in multigravida   . Substance abuse affecting pregnancy, antepartum    cocaine, marijuana  . Tobacco use affecting pregnancy, antepartum     Past Surgical History: Past Surgical History:  Procedure Laterality Date  . NO PAST SURGERIES      Past Obstetric History: # 1 - Date: None, Sex: None, Weight: None, GA: None, Delivery: Spontaneous Abortion, Apgar1: None, Apgar5: None, Living: None, Birth Comments: None  # 2 - Date: None, Sex: None, Weight: None, GA: None, Delivery: Spontaneous Abortion, Apgar1: None, Apgar5: None, Living: None, Birth Comments: None  # 3 - Date: 2004, Sex: Female, Weight: None, GA: None, Delivery: Vaginal, Spontaneous, Apgar1: None, Apgar5: None, Living: Living, Birth Comments: None  # 4 - Date: 2008, Sex: Female, Weight: None, GA: None, Delivery: Vaginal, Spontaneous, Apgar1: None, Apgar5: None, Living: Living, Birth Comments: None  # 5 - Date: 2010, Sex: Female, Weight: None, GA: None, Delivery: Vaginal, Spontaneous, Apgar1: None, Apgar5: None, Living: Living, Birth Comments: None  # 6 - Date:  2012, Sex: Female, Weight: None, GA: None, Delivery: Vaginal, Spontaneous, Apgar1: None, Apgar5: None, Living: Living, Birth Comments: None  # 7 - Date: 2014, Sex: Female, Weight: None, GA: None, Delivery: Vaginal, Spontaneous, Apgar1: None, Apgar5: None, Living: Living, Birth Comments: None  # 8 - Date: 04/09/15, Sex: Female, Weight: 3350 g, GA: None, Delivery: Vaginal, Spontaneous, Apgar1: 8, Apgar5: 9, Living: Living, Birth Comments: Infant has extra digit on both hands  # 9 - Date: 09/30/16, Sex: Female, Weight: 3330 g, GA: None, Delivery: Vaginal, Spontaneous, Apgar1: 5, Apgar5: 7, Living: Living, Birth Comments: None  # 10 - Date: None, Sex: None, Weight: None, GA: None, Delivery: None, Apgar1: None, Apgar5: None, Living: None, Birth Comments: None   Past Gynecologic History:  Family History: Family History  Problem Relation Age of Onset  . Cancer Mother     Social History: Social History   Socioeconomic History  . Marital status: Single    Spouse name: Not on file  . Number of children: Not on file  . Years of education: Not on file  . Highest education level: Not on file  Occupational History  . Not on file  Tobacco Use  . Smoking status: Current Every Day Smoker    Packs/day: 0.25    Types: Cigarettes  . Smokeless tobacco: Never Used  Substance and Sexual Activity  . Alcohol use: Not Currently    Alcohol/week: 1.0 standard drinks    Types: 1 Cans of beer per week    Comment: every now and then  . Drug use: Not  Currently    Types: Marijuana, Cocaine  . Sexual activity: Yes    Birth control/protection: None    Comment: undecided  Other Topics Concern  . Not on file  Social History Narrative  . Not on file   Social Determinants of Health   Financial Resource Strain:   . Difficulty of Paying Living Expenses: Not on file  Food Insecurity:   . Worried About Programme researcher, broadcasting/film/video in the Last Year: Not on file  . Ran Out of Food in the Last Year: Not on file    Transportation Needs:   . Lack of Transportation (Medical): Not on file  . Lack of Transportation (Non-Medical): Not on file  Physical Activity:   . Days of Exercise per Week: Not on file  . Minutes of Exercise per Session: Not on file  Stress:   . Feeling of Stress : Not on file  Social Connections:   . Frequency of Communication with Friends and Family: Not on file  . Frequency of Social Gatherings with Friends and Family: Not on file  . Attends Religious Services: Not on file  . Active Member of Clubs or Organizations: Not on file  . Attends Banker Meetings: Not on file  . Marital Status: Not on file  Intimate Partner Violence:   . Fear of Current or Ex-Partner: Not on file  . Emotionally Abused: Not on file  . Physically Abused: Not on file  . Sexually Abused: Not on file    Medications: Prior to Admission medications   Medication Sig Start Date End Date Taking? Authorizing Provider  potassium chloride (K-DUR) 20 MEQ tablet Take 2 tablets (40 mEq total) by mouth once for 1 dose. 05/31/19 05/31/19  Oswaldo Conroy, CNM  Prenatal Vit-Fe Fumarate-FA (MULTIVITAMIN-PRENATAL) 27-0.8 MG TABS tablet Take 1 tablet by mouth daily at 12 noon.    [provider]    Allergies: No Known Allergies  Physical Exam: Vitals: Blood pressure 136/85, pulse 77, temperature 98.4 F (36.9 C), temperature source Oral, resp. rate 16, height 5\' 4"  (1.626 m), weight 61.2 kg, SpO2 98 %, unknown if currently breastfeeding.  FHT: 140, moderate, +accels, no decels Toco: q8-40min  General: NAD HEENT: normocephalic, anicteric Pulmonary: No increased work of breathing Cardiovascular: RRR, distal pulses 2+ Abdomen: Gravid, non-tender Extremities: no edema, erythema, or tenderness Neurologic: Grossly intact Psychiatric: mood appropriate, affect full  Labs: Results for orders placed or performed during the hospital encounter of 09/07/19 (from the past 24 hour(s))  CBC      Status: Abnormal   Collection Time: 09/07/19  2:32 PM  Result Value Ref Range   WBC 6.9 4.0 - 10.5 K/uL   RBC 3.17 (L) 3.87 - 5.11 MIL/uL   Hemoglobin 10.1 (L) 12.0 - 15.0 g/dL   HCT 14/12/20 (L) 68.1 - 27.5 %   MCV 92.4 80.0 - 100.0 fL   MCH 31.9 26.0 - 34.0 pg   MCHC 34.5 30.0 - 36.0 g/dL   RDW 17.0 01.7 - 49.4 %   Platelets 266 150 - 400 K/uL   nRBC 0.0 0.0 - 0.2 %  Type and screen [facility]     Status: None (Preliminary result)   Collection Time: 09/07/19  2:32 PM  Result Value Ref Range   ABO/RH(D) PENDING    Antibody Screen PENDING    Sample Expiration      09/10/2019,2359 Performed at [facility], [address], [place], [place]  Assessment: 33 y.o. [id number] [redacted]w[redacted]d by 09/28/2019, by [redacted]w[redacted]d Ultrasound presenting with prelabor rupture of membranes  Plan: 1) Prelabor rupture of membranes - monitor contraction patter, if does not increase will augment with pitocin once moved to labor room.  Covid swab pending  2) Fetus - cat I tracing  3) PNL - AB pos / ABSC neg / RI / HBsAg neg / RPR NR / HIV negative / GBS unknown - rapid GBS obtained - for no GBS ppx until rapid returns  4) Immunization History -  There is no immunization history for the selected administration types on file for this patient.  5) Elevated BP - suspect secondary to cocaine abuse.  UDS sent, preeclampsia labs sent  6) Disposition - pending delivery  Malachy Mood, MD, [place], [facility] 09/07/2019, 3:03 PM

## 2019-09-07 NOTE — OB Triage Note (Signed)
Pt. Presented to L/D triage with reported LOF that started at 1245 while she was sleeping. She reports no bleeding and positive fetal movement. Infrequent pains noted, rated 7/10. VSS. Will continue to monitor.

## 2019-09-08 ENCOUNTER — Encounter: Payer: Self-pay | Admitting: Obstetrics and Gynecology

## 2019-09-08 LAB — CBC
HCT: 25.5 % — ABNORMAL LOW (ref 36.0–46.0)
Hemoglobin: 8.9 g/dL — ABNORMAL LOW (ref 12.0–15.0)
MCH: 32.1 pg (ref 26.0–34.0)
MCHC: 34.9 g/dL (ref 30.0–36.0)
MCV: 92.1 fL (ref 80.0–100.0)
Platelets: 220 10*3/uL (ref 150–400)
RBC: 2.77 MIL/uL — ABNORMAL LOW (ref 3.87–5.11)
RDW: 11.9 % (ref 11.5–15.5)
WBC: 10.9 10*3/uL — ABNORMAL HIGH (ref 4.0–10.5)
nRBC: 0 % (ref 0.0–0.2)

## 2019-09-08 LAB — RPR: RPR Ser Ql: NONREACTIVE

## 2019-09-08 MED ORDER — ONDANSETRON HCL 4 MG PO TABS
4.0000 mg | ORAL_TABLET | ORAL | Status: DC | PRN
  Administered 2019-09-08: 4 mg via ORAL
  Filled 2019-09-08: qty 1

## 2019-09-08 MED ORDER — DIBUCAINE (PERIANAL) 1 % EX OINT: 1.0000 "application " | TOPICAL_OINTMENT | CUTANEOUS | Status: DC | PRN

## 2019-09-08 MED ORDER — TETANUS-DIPHTH-ACELL PERTUSSIS 5-2.5-18.5 LF-MCG/0.5 IM SUSP
0.5000 mL | Freq: Once | INTRAMUSCULAR | Status: DC
  Filled 2019-09-08: qty 0.5

## 2019-09-08 MED ORDER — ONDANSETRON HCL 4 MG/2ML IJ SOLN: 4.0000 mg | INTRAMUSCULAR | Status: DC | PRN

## 2019-09-08 MED ORDER — IBUPROFEN 600 MG PO TABS
600.0000 mg | ORAL_TABLET | Freq: Four times a day (QID) | ORAL | Status: DC
  Administered 2019-09-08 – 2019-09-11 (×14): 600 mg via ORAL
  Filled 2019-09-08 (×14): qty 1

## 2019-09-08 MED ORDER — ACETAMINOPHEN 325 MG PO TABS: 650.0000 mg | ORAL_TABLET | ORAL | Status: DC | PRN

## 2019-09-08 MED ORDER — WITCH HAZEL-GLYCERIN EX PADS: 1.0000 "application " | MEDICATED_PAD | CUTANEOUS | Status: DC | PRN

## 2019-09-08 MED ORDER — SIMETHICONE 80 MG PO CHEW: 80.0000 mg | CHEWABLE_TABLET | ORAL | Status: DC | PRN

## 2019-09-08 MED ORDER — SENNOSIDES-DOCUSATE SODIUM 8.6-50 MG PO TABS
2.0000 | ORAL_TABLET | ORAL | Status: DC
  Administered 2019-09-08 – 2019-09-11 (×4): 2 via ORAL
  Filled 2019-09-08 (×4): qty 2

## 2019-09-08 MED ORDER — OXYCODONE-ACETAMINOPHEN 5-325 MG PO TABS
1.0000 | ORAL_TABLET | ORAL | Status: DC | PRN
  Administered 2019-09-08 – 2019-09-11 (×18): 1 via ORAL
  Filled 2019-09-08 (×18): qty 1

## 2019-09-08 MED ORDER — COCONUT OIL OIL: 1.0000 "application " | TOPICAL_OIL | Status: DC | PRN

## 2019-09-08 MED ORDER — PRENATAL MULTIVITAMIN CH
1.0000 | ORAL_TABLET | Freq: Every day | ORAL | Status: DC
  Administered 2019-09-08 – 2019-09-10 (×3): 1 via ORAL
  Filled 2019-09-08 (×4): qty 1

## 2019-09-08 MED ORDER — DIPHENHYDRAMINE HCL 25 MG PO CAPS: 25.0000 mg | ORAL_CAPSULE | Freq: Four times a day (QID) | ORAL | Status: DC | PRN

## 2019-09-08 MED ORDER — OXYCODONE-ACETAMINOPHEN 5-325 MG PO TABS: 2.0000 | ORAL_TABLET | ORAL | Status: DC | PRN

## 2019-09-08 MED ORDER — BENZOCAINE-MENTHOL 20-0.5 % EX AERO: 1.0000 "application " | INHALATION_SPRAY | CUTANEOUS | Status: DC | PRN

## 2019-09-08 NOTE — Discharge Summary (Addendum)
Obstetric Discharge Summary Reason for Admission: rupture of membranes Prenatal Procedures: none Intrapartum Procedures: spontaneous vaginal delivery Postpartum Procedures: Magnesium sulfate Complications-Operative and Postpartum: Preeclampsia with severe features Hemoglobin  Date Value Ref Range Status  09/08/2019 8.9 (L) 12.0 - 15.0 g/dL Final   HGB  Date Value Ref Range Status  05/21/2014 14.7 12.0 - 16.0 g/dL Final   HCT  Date Value Ref Range Status  09/08/2019 25.5 (L) 36.0 - 46.0 % Final  05/21/2014 42.9 35.0 - 47.0 % Final    Physical Exam:  General: upset and agitated, signing out AMA Lochia: appropriate Uterine Fundus: firm Incision: NA DVT Evaluation: No evidence of DVT seen on physical exam.  Discharge Diagnoses: Term Pregnancy-delivered, Substance abuse (cocaine use and MJ use in pregnancy), postpartum preeclampsia with severe features, anemia  Discharge Information: Date: 09/11/2019-patient signed out AMA Activity: pelvic rest Diet: routine Allergies as of 09/11/2019   No Known Allergies     Medication List    STOP taking these medications   potassium chloride SA 20 MEQ tablet Commonly known as: KLOR-CON     TAKE these medications   acetaminophen 325 MG tablet Commonly known as: Tylenol Take 2 tablets (650 mg total) by mouth every 4 (four) hours as needed for mild pain, moderate pain or headache (for pain scale < 4).   amLODipine 10 MG tablet Commonly known as: NORVASC Take 1 tablet (10 mg total) by mouth daily. Start taking on: September 12, 2019   ferrous sulfate 325 (65 FE) MG tablet Take 1 tablet (325 mg total) by mouth daily with breakfast.   multivitamin-prenatal 27-0.8 MG Tabs tablet Take 1 tablet by mouth daily at 12 noon.       Condition: stable Discharge to: home Follow-up Information    Malachy Mood, MD Follow up in 6 week(s).   Specialty: Obstetrics and Gynecology Why: postpartum visit Contact information: [address] [place] [address] [phone]        Dalia Heading, CNM. Go on 09/16/2019.   Specialty: Certified Nurse Midwife Why: at 1:30 PM at [place] information: [place] [address] [phone]           Newborn Data: Live born female child  Birth Weight:  5#6.4 oz APGAR: 64, 9  Newborn Delivery   Birth date/time: 09/08/2019 04:41:00 Delivery type: Vaginal, Spontaneous      Home with CPS (baby to be placed in foster dare).  Dalia Heading 09/11/2019, 5:34 PM

## 2019-09-09 LAB — COMPREHENSIVE METABOLIC PANEL
ALT: 12 U/L (ref 0–44)
AST: 18 U/L (ref 15–41)
Albumin: 2.8 g/dL — ABNORMAL LOW (ref 3.5–5.0)
Alkaline Phosphatase: 121 U/L (ref 38–126)
Anion gap: 8 (ref 5–15)
BUN: 7 mg/dL (ref 6–20)
CO2: 24 mmol/L (ref 22–32)
Calcium: 8.2 mg/dL — ABNORMAL LOW (ref 8.9–10.3)
Chloride: 104 mmol/L (ref 98–111)
Creatinine, Ser: 0.51 mg/dL (ref 0.44–1.00)
GFR calc Af Amer: >60 mL/min (ref >60)
GFR calc non Af Amer: >60 mL/min (ref >60)
Glucose, Bld: 94 mg/dL (ref 70–99)
Potassium: 3.3 mmol/L — ABNORMAL LOW (ref 3.5–5.1)
Sodium: 136 mmol/L (ref 135–145)
Total Bilirubin: 0.6 mg/dL (ref 0.3–1.2)
Total Protein: 6 g/dL — ABNORMAL LOW (ref 6.5–8.1)

## 2019-09-09 MED ORDER — LABETALOL HCL 5 MG/ML IV SOLN: 80.0000 mg | INTRAVENOUS | Status: DC | PRN

## 2019-09-09 MED ORDER — LABETALOL HCL 5 MG/ML IV SOLN
20.0000 mg | INTRAVENOUS | Status: DC | PRN
  Administered 2019-09-10 – 2019-09-11 (×3): 20 mg via INTRAVENOUS
  Filled 2019-09-09 (×4): qty 4

## 2019-09-09 MED ORDER — HYDRALAZINE HCL 20 MG/ML IJ SOLN: 10.0000 mg | INTRAMUSCULAR | Status: DC | PRN

## 2019-09-09 MED ORDER — LABETALOL HCL 5 MG/ML IV SOLN
40.0000 mg | INTRAVENOUS | Status: DC | PRN
  Filled 2019-09-09: qty 8

## 2019-09-09 NOTE — Progress Notes (Signed)
CSW made [facility] CPS report for infant's positive UDS for THC. CSW was advised by Burlene Arnt than case would either be a 24 hour or an immediate response. Chrissie Noa to update CSW once case status is determined.      Virgie Dad Sequoia Witz, MSW, LCSW [facility] [place] [phone]

## 2019-09-09 NOTE — Progress Notes (Signed)
Subjective:  Patient is tolerating regular diet. Her pain is controlled with PO medication. She is ambulating and voiding without difficulty. She expresses concern about her high blood pressure and thinks she has chronic hypertension. She sees spots and is light headed at times when she is at home. She denies headache or epigastric pain. She saw spots this morning. We discussed some causes of high blood pressure including pain, lifestyle, cocaine. She wants to stop using drugs and start classes/therapy. She does not want to lose custody of this baby. CSW is working with patient.   Objective:  Vital signs in last 24 hours: Temp:  [97.9 F (36.6 C)-98.6 F (37 C)] 98.4 F (36.9 C) (12/14 0812) Pulse Rate:  [66-81] 66 (12/14 1023) Resp:  [18-20] 18 (12/14 0812) BP: (123-159)/(85-105) 123/87 (12/14 1023) SpO2:  [99 %-100 %] 99 % (12/14 6144)    General: NAD Pulmonary: no increased work of breathing Abdomen: non-distended, non-tender, fundus firm at level of umbilicus Extremities: no edema, no erythema, no tenderness  Results for orders placed or performed during the hospital encounter of 09/07/19 (from the past 72 hour(s))  CBC     Status: Abnormal   Collection Time: 09/07/19  2:32 PM  Result Value Ref Range   WBC 6.9 4.0 - 10.5 K/uL   RBC 3.17 (L) 3.87 - 5.11 MIL/uL   Hemoglobin 10.1 (L) 12.0 - 15.0 g/dL   HCT 31.5 (L) 40.0 - 86.7 %   MCV 92.4 80.0 - 100.0 fL   MCH 31.9 26.0 - 34.0 pg   MCHC 34.5 30.0 - 36.0 g/dL   RDW 61.9 50.9 - 32.6 %   Platelets 266 150 - 400 K/uL   nRBC 0.0 0.0 - 0.2 %    Comment: Performed at [facility], [address] [place], [place] [address]  Type and screen [facility]     Status: None   Collection Time: 09/07/19  2:32 PM  Result Value Ref Range   ABO/RH(D) AB POS    Antibody Screen NEG    Sample Expiration      09/10/2019,2359 Performed at [facility], [address] [place], [place] [address]     RPR     Status: None   Collection Time: 09/07/19  2:32 PM  Result Value Ref Range   RPR Ser Ql NON REACTIVE NON REACTIVE    Comment: Performed at [facility], [address], [place], [place] [address]  Basic metabolic panel     Status: Abnormal   Collection Time: 09/07/19  2:32 PM  Result Value Ref Range   Sodium 134 (L) 135 - 145 mmol/L   Potassium 3.1 (L) 3.5 - 5.1 mmol/L   Chloride 102 98 - 111 mmol/L   CO2 23 22 - 32 mmol/L   Glucose, Bld 76 70 - 99 mg/dL   BUN <5 (L) 6 - 20 mg/dL   Creatinine, Ser 0.53 (L) 0.44 - 1.00 mg/dL   Calcium 7.9 (L) 8.9 - 10.3 mg/dL   GFR calc non Af Amer >60 >60 mL/min   GFR calc Af Amer >60 >60 mL/min   Anion gap 9 5 - 15    Comment: Performed at [facility], [address], [place], [place] [address]  Respiratory Panel by RT PCR (Flu A&B, Covid) - Nasopharyngeal Swab     Status: None   Collection Time: 09/07/19  2:36 PM   Specimen: Nasopharyngeal Swab  Result Value Ref Range  SARS Coronavirus 2 by RT PCR NEGATIVE NEGATIVE    Comment: (NOTE) SARS-CoV-2 target nucleic acids are NOT DETECTED. The SARS-CoV-2 RNA is generally detectable in upper respiratoy specimens during the acute phase of infection. The lowest concentration of SARS-CoV-2 viral copies this assay can detect is 131 copies/mL. A negative result does not preclude SARS-Cov-2 infection and should not be used as the sole basis for treatment or other patient management decisions. A negative result may occur with  improper specimen collection/handling, submission of specimen other than nasopharyngeal swab, presence of viral mutation(s) within the areas targeted by this assay, and inadequate number of viral copies (<131 copies/mL). A negative result must be combined with clinical observations, patient history, and epidemiological information. The expected result is Negative. Fact Sheet for Patients:  https://www.moore.com/https://www.fda.gov/media/142436/download Fact Sheet for Healthcare  Providers:  https://www.young.biz/https://www.fda.gov/media/142435/download This test is not yet ap proved or cleared by the [place] FDA and  has been authorized for detection and/or diagnosis of SARS-CoV-2 by FDA under an Emergency Use Authorization (EUA). This EUA will remain  in effect (meaning this test can be used) for the duration of the COVID-19 declaration under Section 564(b)(1) of the Act, 21 U.S.C. section 360bbb-3(b)(1), unless the authorization is terminated or revoked sooner.    Influenza A by PCR NEGATIVE NEGATIVE   Influenza B by PCR NEGATIVE NEGATIVE    Comment: (NOTE) The Xpert Xpress SARS-CoV-2/FLU/RSV assay is intended as an aid in  the diagnosis of influenza from Nasopharyngeal swab specimens and  should not be used as a sole basis for treatment. Nasal washings and  aspirates are unacceptable for Xpert Xpress SARS-CoV-2/FLU/RSV  testing. Fact Sheet for Patients: https://www.moore.com/https://www.fda.gov/media/142436/download Fact Sheet for Healthcare Providers: https://www.young.biz/https://www.fda.gov/media/142435/download This test is not yet approved or cleared by the [place] FDA and  has been authorized for detection and/or diagnosis of SARS-CoV-2 by  FDA under an Emergency Use Authorization (EUA). This EUA will remain  in effect (meaning this test can be used) for the duration of the  Covid-19 declaration under Section 564(b)(1) of the Act, 21  U.S.C. section 360bbb-3(b)(1), unless the authorization is  terminated or revoked. Performed at [facility], [address] [place], [place] [address]   Protein / creatinine ratio, urine     Status: None   Collection Time: 09/07/19  2:40 PM  Result Value Ref Range   Creatinine, Urine 40 mg/dL   Total Protein, Urine <6 mg/dL    Comment: NO NORMAL RANGE ESTABLISHED FOR THIS TEST   Protein Creatinine Ratio        0.00 - 0.15 mg/mg[Cre]    Comment: RESULT BELOW REPORTABLE RANGE, UNABLE TO CALCULATE. Performed at [facility], [address] [place], [place] [address]   Group B strep by PCR     Status: None   Collection Time: 09/07/19  2:40 PM   Specimen: Urine; Genital  Result Value Ref Range   Group B strep by PCR NEGATIVE NEGATIVE    Comment: (NOTE) Intrapartum testing with Xpert GBS assay should be used as an adjunct to other methods available and not used to replace antepartum testing (at 35-[redacted] weeks gestation). Performed at [facility], [address] [place], [place] [address]   Chlamydia/NGC rt PCR [facility] only)     Status: None   Collection Time: 09/07/19  2:40 PM   Specimen: Urine  Result Value Ref Range   Specimen source GC/Chlam URINE, RANDOM    Chlamydia Tr NOT DETECTED NOT DETECTED   N  gonorrhoeae NOT DETECTED NOT DETECTED    Comment: (NOTE) This CT/NG assay has not been evaluated in patients with a history of  hysterectomy. Performed at [facility], [address], [place], [place] [address]   Urine Drug Screen, Qualitative [facility] only)     Status: Abnormal   Collection Time: 09/07/19  2:42 PM  Result Value Ref Range   Tricyclic, Ur Screen NONE DETECTED NONE DETECTED   Amphetamines, Ur Screen NONE DETECTED NONE DETECTED   MDMA (Ecstasy)Ur Screen NONE DETECTED NONE DETECTED   Cocaine Metabolite,Ur [place] POSITIVE (A) NONE DETECTED   Opiate, Ur Screen NONE DETECTED NONE DETECTED   Phencyclidine (PCP) Ur S NONE DETECTED NONE DETECTED   Cannabinoid 50 Ng, Ur [place] POSITIVE (A) NONE DETECTED   Barbiturates, Ur Screen NONE DETECTED NONE DETECTED   Benzodiazepine, Ur Scrn NONE DETECTED NONE DETECTED   Methadone Scn, Ur NONE DETECTED NONE DETECTED    Comment: (NOTE) Tricyclics + metabolites, urine    Cutoff 1000 ng/mL Amphetamines + metabolites, urine  Cutoff 1000 ng/mL MDMA (Ecstasy), urine              Cutoff 500 ng/mL Cocaine Metabolite, urine          Cutoff 300 ng/mL Opiate + metabolites, urine        Cutoff 300 ng/mL Phencyclidine (PCP), urine         Cutoff 25 ng/mL Cannabinoid,  urine                 Cutoff 50 ng/mL Barbiturates + metabolites, urine  Cutoff 200 ng/mL Benzodiazepine, urine              Cutoff 200 ng/mL Methadone, urine                   Cutoff 300 ng/mL The urine drug screen provides only a preliminary, unconfirmed analytical test result and should not be used for non-medical purposes. Clinical consideration and professional judgment should be applied to any positive drug screen result due to possible interfering substances. A more specific alternate chemical method must be used in order to obtain a confirmed analytical result. Gas chromatography / mass spectrometry (GC/MS) is the preferred confirmat ory method. Performed at [facility], [address] [place], [place] [address]   CBC     Status: Abnormal   Collection Time: 09/08/19 12:13 PM  Result Value Ref Range   WBC 10.9 (H) 4.0 - 10.5 K/uL   RBC 2.77 (L) 3.87 - 5.11 MIL/uL   Hemoglobin 8.9 (L) 12.0 - 15.0 g/dL   HCT 25.5 (L) 36.0 - 46.0 %   MCV 92.1 80.0 - 100.0 fL   MCH 32.1 26.0 - 34.0 pg   MCHC 34.9 30.0 - 36.0 g/dL   RDW 11.9 11.5 - 15.5 %   Platelets 220 150 - 400 K/uL   nRBC 0.0 0.0 - 0.2 %    Comment: Performed at [facility], [address], [place], [place] [address]    Assessment:   33 y.o. [id number] postpartum day # 1, mild range blood pressure  Plan:    1) Acute blood loss anemia - hemodynamically stable and asymptomatic - po ferrous sulfate  2) Elevated blood pressure: CMP, IV Labetalol protocol for severe range  3) Blood Type --/--/AB POS (12/12 1432) / Rubella 1.04 (12/01 2014) / Varicella Unknown  4) TDAP status administer prior to discharge  5) Feeding plan formula  6)  Contraception: not discussed at this time  7) Disposition: continue current care   Tresea Mall, CNM [facility] 09/09/2019, 10:51 AM

## 2019-09-09 NOTE — Clinical Social Work Maternal (Signed)
CLINICAL SOCIAL WORK MATERNAL/CHILD NOTE  Patient Details  Name: Paige Pierce MRN: [id number] Date of Birth: 03/15/1986  Date:  15-May-2019  Clinical Social Worker Initiating Note:  Durward Fortes, LCSW Date/Time: Initiated:  09/09/19/0815     Child's Name:  Paige Pierce   Biological Parents:  Mother, Father(Valeree Kena, [place])   Need for Interpreter:  None   Reason for Referral:  Current Substance Use/Substance Use During Pregnancy , Late or No Prenatal Care    Address:  [place] [address]    Phone number:  [phone] (home)     Additional phone number: none   Household Members/Support Persons (HM/SP):   Household Member/Support Person 1   HM/SP Name Relationship DOB or Age  HM/SP -1 Pearline Cables  [facility]   02/09/1986  HM/SP -2        HM/SP -3        HM/SP -4        HM/SP -5        HM/SP -6        HM/SP -7        HM/SP -8          Natural Supports (not living in the home):  Parent   Professional Supports: Other (Comment)(Payee with [employer])   Employment: Disabled   Type of Work: gets disability per [employer] report.   Education:  9 to 11 years   Homebound arranged:  n/a  Financial Resources:  Medicaid, SSI/Disability   Other Resources:  (plans to apply per MOB's report.)   Cultural/Religious Considerations Which May Impact Care:  none reported.   Strengths:  Ability to meet basic needs , Compliance with medical plan , Home prepared for child , Pediatrician chosen   Psychotropic Medications:         Pediatrician:    [facility]  Pediatrician List:   [place]      Pediatrician Fax Number:    Risk Factors/Current Problems:  Substance Use    Cognitive State:  Able to Concentrate , Alert , Insightful    Mood/Affect:  Calm , Comfortable , Relaxed , Interested , Happy     CSW Assessment: CSW consulted as MOB had limited PNC as well as THC use during pregnancy. CSW called into MOB's room to address further needs.   CSW introduced role via phone. CSW advised MOB of CSW's role and the reason for CSW calling. CSW also asked MOB if was okay for CSW to speak with her via phone regarding concerns-MOB agreeable.   CSW inquired from [facility] on her THC use during pregnancy. MOB reported that she ddi use THC and reports that her last use was about three weeks ago. CSW understanding but also advised MOB of the hospital drug screen policy. MOB was informed that infant UDS was positive for [facility] and that a CPS report would need to be made. MOB reported that she understood and asked CSW if this would prevent her from taking infant home.? CSW advised MOB that CSW would call CPS and then update her. MOB began to tell CSW that she does have previous CPS history with her other children. MOB reported to CSW that she doesn't have custody of any of her other children. When CSW inquired on where her children were, MOB reported "they are all placed with other family members".  CSW asked if MOB was working to get them back and MOB reported that all of her children have been adopted out. MOB unsure of the names of the individuals in which other children are with. CSW understanding and advised MOB that CSW would also have to update CPS of this. MOB once again asked if this would affect her taking infant home. CSW updated MOB that CSW would have to give her updates as CSW found them vis CPS. MOB very understanding and thanked.   CSW inquired from MOB on her mental heath.  MOB reported that she doesn't have any mental health diagnosis but does get disability due to her ina ility to understand reading and writing as easily as others. CSW understanding of this. MOB reported that her sister and step father are her primary support. MOB advised CSW that her mother passed away and this has "made me wanna get on  the right road with my life". CSW offered MO encouraging words regarding her past history. MOB reported understanding and thanked CSW. MOB advised CSW that she has all needed items to care for infant with no other needs at this time.   CSW took time to provide MOB with education on PPD and SIDS. MOB reported that she has never dealt with PPD but reported interest in knowing the signs and symptoms as they relate to PPD. MOB reported that she has a safe place for infant to sleep with no other concerns at this time.   CSW will continue to monitor infants CDS and make CPS report for positive UDS on infant.  CSW Plan/Description:  Perinatal Mood and Anxiety Disorder (PMADs) Education, Sudden Infant Death Syndrome (SIDS) Education, Child Protective Service Report , CSW Will Continue to Monitor Umbilical Cord Tissue Drug Screen Results and Make Report if Warranted, Hospital Drug Screen Policy Information    Rashea Hoskie S Kirin Brandenburger, LCSWA 09/09/2019, 8:48 AM 

## 2019-09-09 NOTE — Progress Notes (Signed)
CSW receive call from Clinton Quant with [place] CPS. CSW was advised at this time Ms, Paige Pierce as spoken with MOB and will be out to meet with MOB tomorrow 09/10/19 at 8am. CSW updated MOB of this as requested. CSW will continue to follow for any further needs at this time.       Paige Pierce, MSW, LCSW [facility] [place] at [place] [phone]

## 2019-09-10 LAB — PROTEIN / CREATININE RATIO, URINE
Creatinine, Urine: 112 mg/dL
Protein Creatinine Ratio: 0.08 mg/mg{Cre} (ref 0.00–0.15)
Total Protein, Urine: 9 mg/dL

## 2019-09-10 MED ORDER — LACTATED RINGERS IV SOLN: INTRAVENOUS | Status: DC

## 2019-09-10 MED ORDER — CALCIUM GLUCONATE 10 % IV SOLN
INTRAVENOUS | Status: AC
  Filled 2019-09-10: qty 10

## 2019-09-10 MED ORDER — MAGNESIUM SULFATE 40 GM/1000ML IV SOLN
2.0000 g/h | INTRAVENOUS | Status: AC
  Administered 2019-09-11: 2 g/h via INTRAVENOUS
  Filled 2019-09-10 (×2): qty 1000

## 2019-09-10 MED ORDER — MAGNESIUM SULFATE BOLUS VIA INFUSION
4.0000 g | Freq: Once | INTRAVENOUS | Status: AC
  Administered 2019-09-10: 4 g via INTRAVENOUS
  Filled 2019-09-10: qty 1000

## 2019-09-10 MED ORDER — AMLODIPINE BESYLATE 5 MG PO TABS
5.0000 mg | ORAL_TABLET | Freq: Every day | ORAL | Status: DC
  Administered 2019-09-10: 5 mg via ORAL
  Filled 2019-09-10 (×2): qty 1

## 2019-09-10 NOTE — Progress Notes (Signed)
CSW received update call from Q. Stallings with [place] CPS. CSW was advised that at this time. CPS worker would need discharge date and time for infant and MOB once set as MOB is aware that " baby wont be going home with mom".  Per CPS social worker, her and other staff will staff case this afternoon and make a final decision once homes of individuals given by MOB have been looked into. CSW was made aware that there is a possibility that CPS may take custody of infant. CSW clarified with CPS worker regarding infants ability to remain in the room with MOB. Per CPS, infant is allowed to remain in room with MOB at this time. CSW will follow up with CPS worker in the morning for further updates.      Paige Pierce S. Emaree Chiu, MSW, LCSW [facility] at [place] [phone]   

## 2019-09-10 NOTE — Progress Notes (Signed)
Post Partum Day 2 Subjective: CTSP due to severe range blood pressures: 179/106, then 164/95. Patient reports pain in left thoracic back, transient headaches, but no visual changes, RUQ pain, nausea or vomiting. Platelets and BUN, CR, LFTS WNL.  UDS +cocaine and MJ. Is bottle feeding baby.   Objective: Blood pressure (!) 164/95, pulse 68, temperature 98.8 F (37.1 C), temperature source Oral, resp. rate 18, height 5\' 4"  (1.626 m), weight 61.2 kg, SpO2 100 %, unknown if currently breastfeeding.  Physical Exam:  General: alert, cooperative and no distress  Heart: RRR without murmur Lungs: CTAB, normal respiratory effort Lochia: appropriate Uterine Fundus: firm per RN exam  DVT Evaluation: No evidence of DVT seen on physical exam. DTRs: +1 to +2  Recent Labs    09/07/19 1432 09/08/19 1213  HGB 10.1* 8.9*  HCT 29.3* 25.5*  WBC 6.9 10.9*  PLT 266 220   Results for orders placed or performed during the hospital encounter of 09/07/19 (from the past 48 hour(s))  CBC     Status: Abnormal   Collection Time: 09/08/19 12:13 PM  Result Value Ref Range   WBC 10.9 (H) 4.0 - 10.5 K/uL   RBC 2.77 (L) 3.87 - 5.11 MIL/uL   Hemoglobin 8.9 (L) 12.0 - 15.0 g/dL   HCT 25.5 (L) 36.0 - 46.0 %   MCV 92.1 80.0 - 100.0 fL   MCH 32.1 26.0 - 34.0 pg   MCHC 34.9 30.0 - 36.0 g/dL   RDW 11.9 11.5 - 15.5 %   Platelets 220 150 - 400 K/uL   nRBC 0.0 0.0 - 0.2 %    Comment: Performed at [facility], [address] [place], [place] [address]  Comprehensive metabolic panel     Status: Abnormal   Collection Time: 09/09/19 11:23 AM  Result Value Ref Range   Sodium 136 135 - 145 mmol/L   Potassium 3.3 (L) 3.5 - 5.1 mmol/L   Chloride 104 98 - 111 mmol/L   CO2 24 22 - 32 mmol/L   Glucose, Bld 94 70 - 99 mg/dL   BUN 7 6 - 20 mg/dL   Creatinine, Ser 0.51 0.44 - 1.00 mg/dL   Calcium 8.2 (L) 8.9 - 10.3 mg/dL   Total Protein 6.0 (L) 6.5 - 8.1 g/dL   Albumin 2.8 (L) 3.5 - 5.0 g/dL   AST 18 15 - 41  U/L   ALT 12 0 - 44 U/L   Alkaline Phosphatase 121 38 - 126 U/L   Total Bilirubin 0.6 0.3 - 1.2 mg/dL   GFR calc non Af Amer >60 >60 mL/min   GFR calc Af Amer >60 >60 mL/min   Anion gap 8 5 - 15    Comment: Performed at [facility], [address], [place], [place] [address]   Assessment/Plan: PPD #2 Preeclampsia with severe range blood pressures  Transfer back to L&D for magnesium sulfate  Norvasc 5 mgm now and Percocet for pain  Restart IV  IV labetalol for severe range blood pressures  Strict I&O  TED hose for DVT PPX Continue postpartum care Substance abuse: seen by CSW-report to CPS made Bottle AB POS/ RI Contraception ?      LOS: 3 days   Paige Pierce 09/10/2019, 9:06 AM

## 2019-09-10 NOTE — Progress Notes (Signed)
Social worker seen leaving the patient's room. RN went to try and start the patient on magnesium and get a blood pressure. Pt refused and stated "my social worker ain't gone yet. She coming back". Then RN asked if she could at least get a blood pressure? Pt stated, "no I need to make these phone calls." RN made provider aware of patient's refusal. RN to attempt again in a few minutes. Will continue to monitor.

## 2019-09-10 NOTE — Progress Notes (Signed)
CSW spoke with MOB at bedside to offer further supports. CSW was advised by MOB that she has been doing well and that CPS worker, has been in contact with her. MOB reported that CPS worker was to arrive at the hospital to speak with MOB around 8am. CSW spoke with CPS worker and confirmed that she would be to see MOB soon. Once CPS arrived, CSW escorted CPS worker to MOB's room. CSW left CPS worker in room to assess MOB for further plans regarding infant. CPS worker to follow up with CSW once finished speaking with MOB.      Briannia Laba S. Aspasia Rude, MSW, LCSW [facility] at [place] [phone]   

## 2019-09-11 DIAGNOSIS — O1410 Severe pre-eclampsia, unspecified trimester: Secondary | ICD-10-CM | POA: Diagnosis not present

## 2019-09-11 LAB — URINE CULTURE: Special Requests: NORMAL

## 2019-09-11 MED ORDER — ASCORBIC ACID 500 MG PO TABS
500.0000 mg | ORAL_TABLET | Freq: Two times a day (BID) | ORAL | Status: DC
  Administered 2019-09-11: 500 mg via ORAL
  Filled 2019-09-11: qty 1

## 2019-09-11 MED ORDER — ACETAMINOPHEN 325 MG PO TABS: 650.0000 mg | ORAL_TABLET | ORAL | 1 refills | Status: DC | PRN

## 2019-09-11 MED ORDER — GUAIFENESIN ER 600 MG PO TB12
600.0000 mg | ORAL_TABLET | Freq: Two times a day (BID) | ORAL | Status: DC
  Administered 2019-09-11: 600 mg via ORAL
  Filled 2019-09-11: qty 1

## 2019-09-11 MED ORDER — AMLODIPINE BESYLATE 10 MG PO TABS
10.0000 mg | ORAL_TABLET | Freq: Every day | ORAL | Status: DC
  Administered 2019-09-11: 10 mg via ORAL
  Filled 2019-09-11: qty 1

## 2019-09-11 MED ORDER — FERROUS SULFATE 325 (65 FE) MG PO TABS: 325.0000 mg | ORAL_TABLET | Freq: Two times a day (BID) | ORAL | Status: DC

## 2019-09-11 MED ORDER — FERROUS SULFATE 325 (65 FE) MG PO TABS: 325.0000 mg | ORAL_TABLET | Freq: Every day | ORAL | 3 refills | Status: AC

## 2019-09-11 MED ORDER — LORATADINE 10 MG PO TABS
10.0000 mg | ORAL_TABLET | Freq: Every day | ORAL | Status: DC
  Administered 2019-09-11: 10 mg via ORAL
  Filled 2019-09-11: qty 1

## 2019-09-11 MED ORDER — AMLODIPINE BESYLATE 10 MG PO TABS: 10.0000 mg | ORAL_TABLET | Freq: Every day | ORAL | 2 refills | Status: DC

## 2019-09-11 NOTE — Progress Notes (Signed)
CSW provided MOB with outpatient substance abuse resources as asked.    Kiegan Macaraeg S. Celeste Candelas, MSW, LCSW [facility] at [place] [phone]  

## 2019-09-11 NOTE — Progress Notes (Addendum)
  CSW spoke with CPS worker and was advised that at this time she is awaiting signature from judge to take custody of infant. CSW was notified by CPS worker that infant will be placed with [facility] and that [place] worker and [facility] will arrive to get infant this afternoon. CSW notified CPS worker that CSW had been notified that it is [facility] policy that when infants are placed into the custody of DSS, infant is no longer allowed to be in the room with  MOB. CPS worker reported understanding but also asked that CSW do not tell MOB that CPS is taking custody as she would be at the hospital to advised MOB of this. CSW was made aware that CPS is okay with infant remaining in the room until they arrive. CSW has updated staff of this and will escort CPS worker to [facility] room once arrived.       Paige Pierce, MSW, LCSW [facility] and [place] at [place] [phone]

## 2019-09-11 NOTE — Progress Notes (Signed)
CSW received call from MD Minter with [facility]. CSW was advised that if placement is found for infant today, then infant is ready to discharge. CSW spoke with Q. Stallings, CPS worker with [place] ad was advised that at this time, she is working on placement  for infant which should be ready today. CSW asked that CPS worker update CSW once placement has been confirmed for infant. CPS worker agreeable.   CSW updated MD of this and decision has been made that CSW will updated MD of placement confirmation once CPS worker updates CSW.     Leandrea Ackley S. Quentez Lober, MSW, LCSW [facility] at [place] [phone]   

## 2019-09-11 NOTE — Progress Notes (Signed)
Pt left AMA at 1745 with significant other

## 2019-09-11 NOTE — Progress Notes (Signed)
CSW has made all copies of information regarding placement at this time. CSW has placed all documentation on chart. No other CSW needs, CSW signs off.      Virgie Dad. Cosimo Schertzer, MSW, LCSW [facility] [place] at [place] [phone]

## 2019-09-11 NOTE — Progress Notes (Signed)
Progress Note-PPD #3   S: "Don't try to talk me out of it.Marland Kitchenibuprofen am going home! Take this IV out!" CPS and CSW came to remove baby from patient's care. Baby being placed with foster family. Patient upset... does not want to stay. Wants to sign out AMA.   BP (!) 154/98   Pulse 83   Temp 98.3 F (36.8 C) (Oral)   Resp 18   Ht 5\' 4"  (1.626 m)   Wt 61.2 kg   SpO2 100%   Breastfeeding Unknown   BMI 23.17 kg/m    Blood pressures have been normal to mild range on 10 mgm Norvasc  Patient did agree to take Norvasc 10 mgm daily and to come to see me on Monday 21 December at 1330 for a blood pressure check and to discuss contraception.   Dalia Heading, CNM

## 2019-09-11 NOTE — Discharge Instructions (Signed)
°Vaginal Delivery, Care After °Refer to this sheet in the next few weeks. These discharge instructions provide you with information on caring for yourself after delivery. Your caregiver may also give you specific instructions. Your treatment has been planned according to the most current medical practices available, but problems sometimes occur. Call your caregiver if you have any problems or questions after you go home. °HOME CARE INSTRUCTIONS °1. Take over-the-counter or prescription medicines only as directed by your caregiver or pharmacist. °2. Do not drink alcohol, especially if you are breastfeeding or taking medicine to relieve pain. °3. Do not smoke tobacco. °4. Continue to use good perineal care. Good perineal care includes: °1. Wiping your perineum from back to front °2. Keeping your perineum clean. °3. You can do sitz baths twice a day, to help keep this area clean °5. Do not use tampons, douche or have sex for 6 weeks °6. Shower only and avoid sitting in submerged water, aside from sitz baths °7. Wear a well-fitting bra that provides breast support. °8. Eat healthy foods. °9. Drink enough fluids to keep your urine clear or pale yellow. °10. Eat high-fiber foods such as whole grain cereals and breads, brown rice, beans, and fresh fruits and vegetables every day. These foods may help prevent or relieve constipation. °11. Avoid constipation with high fiber foods or medications, such as miralax or metamucil °12. Follow your caregiver's recommendations regarding resumption of activities such as climbing stairs, driving, lifting, exercising, or traveling. °13. Talk to your caregiver about resuming sexual activities. Resumption of sexual activities after 6 weeks is dependent upon your risk of infection, your rate of healing, and your comfort and desire to resume sexual activity. °14. Try to have someone help you with your household activities and your newborn for at least a few days after you leave the  hospital. °15. Rest as much as possible. Try to rest or take a nap when your newborn is sleeping. °16. Increase your activities gradually. °17. Keep all of your scheduled postpartum appointments. It is very important to keep your scheduled follow-up appointments. At these appointments, your caregiver will be checking to make sure that you are healing physically and emotionally. °SEEK MEDICAL CARE IF:  °· You are passing large clots from your vagina. Save any clots to show your caregiver. °· You have a foul smelling discharge from your vagina. °· You have trouble urinating. °· You are urinating frequently. °· You have pain when you urinate. °· You have a change in your bowel movements. °· You have increasing redness, pain, or swelling near your vaginal incision (episiotomy) or vaginal tear. °· You have pus draining from your episiotomy or vaginal tear. °· Your episiotomy or vaginal tear is separating. °· You have painful, hard, or reddened breasts. °· You have a severe headache. °· You have blurred vision or see spots. °· You feel sad or depressed. °· You have thoughts of hurting yourself or your newborn. °· You have questions about your care, the care of your newborn, or medicines. °· You are dizzy or light-headed. °· You have a rash. °· You have nausea or vomiting. °· You were breastfeeding and have not had a menstrual period within 12 weeks after you stopped breastfeeding. °· You are not breastfeeding and have not had a menstrual period by the 12th week after delivery. °· You have a fever of 100.5 or more °SEEK IMMEDIATE MEDICAL CARE IF:  °· You have persistent pain. °· You have chest pain. °· You have shortness   of breath. °· You faint. °· You have leg pain. °· You have stomach pain. °· Your vaginal bleeding saturates two or more sanitary pads in 1 hour. °MAKE SURE YOU:  °· Understand these instructions. °· Will watch your condition. °· Will get help right away if you are not doing well or get worse. °Document  Released: 09/09/2000 Document Revised: 01/27/2014 Document Reviewed: 05/09/2012 °ExitCare® Patient Information ©2015 [employer]. This information is not intended to replace advice given to you by your health care provider. Make sure you discuss any questions you have with your health care provider. ° °Sitz Bath °A sitz bath is a warm water bath taken in the sitting position. The water covers only the hips and butt (buttocks). We recommend using one that fits in the toilet, to help with ease of use and cleanliness. It may be used for either healing or cleaning purposes. Sitz baths are also used to relieve pain, itching, or muscle tightening (spasms). The water may contain medicine. Moist heat will help you heal and relax.  °HOME CARE  °Take 3 to 4 sitz baths a day. °18. Fill the bathtub half-full with warm water. °19. Sit in the water and open the drain a little. °20. Turn on the warm water to keep the tub half-full. Keep the water running constantly. °21. Soak in the water for 15 to 20 minutes. °22. After the sitz bath, pat the affected area dry. °GET HELP RIGHT AWAY IF: °You get worse instead of better. Stop the sitz baths if you get worse. °MAKE SURE YOU: °· Understand these instructions. °· Will watch your condition. °· Will get help right away if you are not doing well or get worse. °Document Released: 10/20/2004 Document Revised: 06/06/2012 Document Reviewed: 01/10/2011 °ExitCare® Patient Information ©2015 [employer]. This information is not intended to replace advice given to you by your health care provider. Make sure you discuss any questions you have with your health care provider. ° ° °

## 2019-09-11 NOTE — Progress Notes (Addendum)
Post Partum Day 3. 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 Also complains of cough and  nasal congestion.  CSW and CPS have seen patient. Baby will not be going home with patient. She has been caring for baby appropriately in the room. CPS is evaluating suitability of family members to care for the baby .  Objective: Has received labetalol 20 mgm IV three time over the last 24 hours, the last time at 0754. Marland Kitchen Her Norvasc was increased to 10 mgm daily this AM from 5 mgm.   Blood pressure (!) 154/95, pulse 76, temperature 98 F (36.7 C), temperature source Oral, resp. rate 17, height 5\' 4"  (1.626 m), weight 61.2 kg, SpO2 99 %, unknown if currently breastfeeding. Patient Vitals for the past 24 hrs:  BP Temp Temp src Pulse Resp SpO2  09/11/19 0806 (!) 154/95 - - 76 - 99 %  09/11/19 0746 (!) 162/93 - - 77 - 99 %  09/11/19 0730 (!) 162/94 98 F (36.7 C) Oral 82 17 99 %  09/11/19 0700 - - - - - 100 %  09/11/19 0627 (!) 151/96 - - 81 - -  09/11/19 0615 - - - - - 100 %  09/11/19 0605 - - - - - 100 %  09/11/19 0600 - - - - - 100 %  09/11/19 0555 (!) 151/94 - - 76 - 100 %  09/11/19 0550 - - - - - 99 %  09/11/19 0540 (!) 150/95 - - 83 - 99 %  09/11/19 0534 (!) 155/132 - - 69 18 -  09/11/19 0500 - - - - - 100 %  09/11/19 0426 (!) 150/85 98.3 F (36.8 C) Oral 81 18 -  09/11/19 0330 (!) 150/91 - - 81 - 99 %  09/11/19 0310 - - - - - 98 %  09/11/19 0305 - - - - - 98 %  09/11/19 0300 - - - - - 99 %  09/11/19 0255 - - - - - 98 %  09/11/19 0250 - - - - - 99 %  09/11/19 0245 - - - - - 98 %  09/11/19 0240 - - - - - 98 %  09/11/19 0235 - - - - - 99 %  09/11/19 0225 131/85 - - 75 16 97 %  09/11/19 0220 - - - - - 97 %  09/11/19 0215 - - - - - 99 %  09/11/19 0125 (!) 150/82 - - 83 - 100 %  09/11/19 0120  - - - - - 100 %  09/11/19 0115 - - - - - 100 %  09/11/19 0110 - - - - - 100 %  09/11/19 0100 - - - - - 100 %  09/11/19 0055 (!) 149/91 - - 79 - 100 %  09/11/19 0050 - - - - - 100 %  09/11/19 0040 (!) 169/105 98.1 F (36.7 C) Oral 84 18 94 %  09/11/19 0035 (!) 170/109 - - 71 - 100 %  09/10/19 2350 - - - - - 100 %  09/10/19 2340 (!) 152/88 - - 82 - 100 %  09/10/19 2335 - - - - - 99 %  09/10/19 2330 - - - - - 100 %  09/10/19 2325 (!) 162/93 - - 79 18 100 %  09/10/19 2320 - - - - - 100 %  09/10/19  2225 137/88 - - 74 16 100 %  09/10/19 2130 - - - - - 93 %  09/10/19 2125 (!) 156/92 - - 73 18 100 %  09/10/19 2025 129/79 - - 79 16 99 %  09/10/19 1925 133/80 98 F (36.7 C) Oral 71 16 100 %  09/10/19 1720 (!) 152/86 - - - - -  09/10/19 1639 (!) 143/82 - - - - -  09/10/19 1539 (!) 147/91 - - - - -  09/10/19 1435 (!) 144/84 - - 75 - -  09/10/19 1424 (!) 148/84 - - - - -  09/10/19 1354 (!) 144/93 - - - - -  09/10/19 1330 - - - - 19 -  09/10/19 1319 (!) 152/107 - - - - -  09/10/19 1309 (!) 149/87 - - - - -  09/10/19 1237 (!) 163/100 - - - - -  09/10/19 1204 (!) 175/96 - - - - -  09/10/19 1134 (!) 157/94 - - - - -  09/10/19 1104 (!) 142/86 - - - - -  09/10/19 1052 (!) 142/90 - - 72 - -  09/10/19 1034 (!) 120/93 - - 88 - -  09/10/19 1019 (!) 155/100 - - 75 - -   Urine output: 5325 urine output with 2553 ml intake  Physical Exam:  General: alert, cooperative and no distress  Heart: RRR without murmur Lungs: inspiratory wheeze and rhonchi in right lower lobe cleared with cough (patient is a smoker) Lochia: appropriate Uterine Fundus: firm ? At U+2. After voiding FF at U-3FB  DVT Evaluation: No evidence of DVT seen on physical exam. DTRS: +2  Recent Labs    09/08/19 1213  HGB 8.9*  HCT 25.5*  WBC 10.9*  PLT 220    Assessment/Plan: PPD #3  Preeclampsia with mild to severe range blood pressures             Discontinue magnesium sulfate at 1030 this AM  Continue to monitor  blood pressures closely             Norvasc 10 mgm daily             If continues to need IV labetalol for severe range blood pressures-will add second agent  orally             Strict I&O             TED hose for DVT PPX Continue postpartum care Chronic anemia worsened by acute blood loss: start po iron supplements Bottle AB POS/ RI Contraception ?     LOS: 4 days   Dalia Heading 09/11/2019, 9:21 AM

## 2019-09-11 NOTE — Progress Notes (Signed)
Social worker came out to desk stating that the patient was wanting to leave AMA at 1650. SW states she discussed with the patient the reason she is still being monitored. I went into room and also discussed the AMA process and that it was in her best interest to stay so her BP can be monitored. CNM notified of the patient wanting to leave AMA. CNM went to bedside and also had conversation with patient. Pt is insistent on leaving AMA. AMA form signed by patient.

## 2019-09-11 NOTE — Progress Notes (Signed)
CSW went to check on MOB considering CPS involvement. CSW entered the room where MOB was sitting up holding infant feeding. CSW advised MOB of why CSW had come to check in on her. MOB reported that she is feeling fine and staying hopeful about the placement of infant. CSW expressed to MOB that CSW has been in contact with CPS worker and that CSW is still awaiting further updates. MOB began to cry as MOB reported that things have been hard for her knowing that she will not be taking infant home at the time of discharge. MOB expressed that she and CPS worker have dicussed a plan that will aide MOB in getting clean from substances that in turn will assist MOB in getting infant back. MOB reported that she has been reaching out to different substance abuse treatment facilities to get further help. CSW commended MOB on this and encouraged MOB to keep searching for facility. CSW advised MOB that CSW would also leave resources on paperwork for MOB. MOB thanked CSW and expressed the desire to send more time with infant. CSW left room for this.       Jusiah Aguayo S. Aseel Uhde, MSW, LCSW [facility] [phone]   

## 2020-02-04 ENCOUNTER — Other Ambulatory Visit: Payer: Self-pay

## 2020-02-04 ENCOUNTER — Encounter: Payer: Self-pay | Admitting: Emergency Medicine

## 2020-02-04 ENCOUNTER — Emergency Department
Admission: EM | Admit: 2020-02-04 | Discharge: 2020-02-04 | Disposition: A | Discharge status: Home / Self Care | Payer: Medicaid Other | Attending: Emergency Medicine | Admitting: Emergency Medicine

## 2020-02-04 DIAGNOSIS — Z79899 Other long term (current) drug therapy: Secondary | ICD-10-CM | POA: Diagnosis not present

## 2020-02-04 DIAGNOSIS — Z3202 Encounter for pregnancy test, result negative: Secondary | ICD-10-CM | POA: Insufficient documentation

## 2020-02-04 DIAGNOSIS — F17210 Nicotine dependence, cigarettes, uncomplicated: Secondary | ICD-10-CM | POA: Insufficient documentation

## 2020-02-04 LAB — POCT PREGNANCY, URINE: Preg Test, Ur: NEGATIVE

## 2020-02-04 NOTE — ED Provider Notes (Signed)
Emergency Department Provider Note  ____________________________________________  Time seen: Approximately 9:37 PM  I have reviewed the triage vital signs and the nursing notes.   HISTORY  Chief Complaint Emesis and Amenorrhea   Historian Patient     HPI Paige Pierce is a 34 y.o. female presents to the emergency department with concern for possible pregnancy.  Patient states that she has approximately 1 month late.  She denies vaginal bleeding or abdominal pain.  Patient states that she originally also wanted to be seen for a "knot" that she has palpated along the left side of her chest wall.  Patient states that she has become hungry while waiting in the emergency department and would only like to be tested for pregnancy.  She denies chest pain, chest tightness or abdominal pain.  No other alleviating measures have been attempted.   Past Medical History:  Diagnosis Date  . Advanced maternal age in multigravida   . Substance abuse affecting pregnancy, antepartum    cocaine, marijuana  . Tobacco use affecting pregnancy, antepartum      Immunizations up to date:  Yes.     Past Medical History:  Diagnosis Date  . Advanced maternal age in multigravida   . Substance abuse affecting pregnancy, antepartum    cocaine, marijuana  . Tobacco use affecting pregnancy, antepartum     Patient Active Problem List   Diagnosis Date Noted  . Postpartum care following vaginal delivery 09/11/2019  . Severe preeclampsia 09/11/2019  . Premature rupture of membranes 09/07/2019  . Back pain affecting pregnancy 08/27/2019  . Preterm uterine contractions in third trimester, antepartum 08/03/2019  . Normal labor 09/30/2016  . No prenatal care in current pregnancy 09/30/2016  . Cocaine substance abuse (HCC) 04/09/2015    Past Surgical History:  Procedure Laterality Date  . NO PAST SURGERIES      Prior to Admission medications   Medication Sig Start Date End Date Taking?  Authorizing Provider  acetaminophen (TYLENOL) 325 MG tablet Take 2 tablets (650 mg total) by mouth every 4 (four) hours as needed for mild pain, moderate pain or headache (for pain scale < 4). 09/11/19   Farrel Conners, CNM  amLODipine (NORVASC) 10 MG tablet Take 1 tablet (10 mg total) by mouth daily. 09/12/19   Farrel Conners, CNM  ferrous sulfate 325 (65 FE) MG tablet Take 1 tablet (325 mg total) by mouth daily with breakfast. 09/11/19   Farrel Conners, CNM  Prenatal Vit-Fe Fumarate-FA (MULTIVITAMIN-PRENATAL) 27-0.8 MG TABS tablet Take 1 tablet by mouth daily at 12 noon.    [provider]    Allergies Patient has no known allergies.  Family History  Problem Relation Age of Onset  . Cancer Mother     Social History Social History   Tobacco Use  . Smoking status: Current Every Day Smoker    Packs/day: 0.25    Types: Cigarettes  . Smokeless tobacco: Never Used  Substance Use Topics  . Alcohol use: Not Currently    Alcohol/week: 1.0 standard drinks    Types: 1 Cans of beer per week    Comment: every now and then  . Drug use: Not Currently    Types: Marijuana, Cocaine     Review of Systems  Constitutional: No fever/chills Eyes:  No discharge ENT: No upper respiratory complaints. Respiratory: no cough. No SOB/ use of accessory muscles to breath Gastrointestinal: Patient had nausea.  Musculoskeletal: Negative for musculoskeletal pain. Skin: Negative for rash, abrasions, lacerations, ecchymosis.    ____________________________________________  PHYSICAL EXAM:  VITAL SIGNS: ED Triage Vitals [02/04/20 1934]  Enc Vitals Group     BP (!) 152/89     Pulse Rate 97     Resp 18     Temp 98.4 F (36.9 C)     Temp Source Oral     SpO2 98 %     Weight 120 lb (54.4 kg)     Height 5\' 4"  (1.626 m)     Head Circumference      Peak Flow      Pain Score 7     Pain Loc      Pain Edu?      Excl. in [place]?      Constitutional: Alert and oriented. Well  appearing and in no acute distress. Eyes: Conjunctivae are normal. PERRL. EOMI. Head: Atraumatic. Cardiovascular: Normal rate, regular rhythm. Normal S1 and S2.  Good peripheral circulation. Respiratory: Normal respiratory effort without tachypnea or retractions. Lungs CTAB. Good air entry to the bases with no decreased or absent breath sounds Gastrointestinal: Bowel sounds x 4 quadrants. Soft and nontender to palpation. No guarding or rigidity. No distention. Musculoskeletal: Full range of motion to all extremities. No obvious deformities noted.  Patient has a 2 cm x 2 cm firm, fixed, tender mass palpated along left lateral chest wall. Neurologic:  Normal for age. No gross focal neurologic deficits are appreciated.  Skin:  Skin is warm, dry and intact. No rash noted. Psychiatric: Mood and affect are normal for age. Speech and behavior are normal.   ____________________________________________   LABS (all labs ordered are listed, but only abnormal results are displayed)  Labs Reviewed  POC URINE PREG, ED  POCT PREGNANCY, URINE   ____________________________________________  EKG   ____________________________________________  RADIOLOGY   No results found.  ____________________________________________    PROCEDURES  Procedure(s) performed:     Procedures     Medications - No data to display   ____________________________________________   INITIAL IMPRESSION / ASSESSMENT AND PLAN / ED COURSE  Pertinent labs & imaging results that were available during my care of the patient were reviewed by me and considered in my medical decision making (see chart for details).      Assessment and plan Concern for possible pregnancy. 34 year old female presents to the emergency department with concern for possible pregnancy.  Patient's urine pregnancy test was negative in the emergency department.  She declined evaluation for chest wall mass.  Return precautions were given  to return with new or worsening symptoms.  All patient questions were answered.  ____________________________________________  FINAL CLINICAL IMPRESSION(S) / ED DIAGNOSES  Final diagnoses:  Negative pregnancy test      NEW MEDICATIONS STARTED DURING THIS VISIT:  ED Discharge Orders    None          This chart was dictated using voice recognition software/Dragon. Despite best efforts to proofread, errors can occur which can change the meaning. Any change was purely unintentional.     Lannie Fields, PA-C 02/04/20 2140    Nance Pear, MD 02/04/20 2205

## 2020-02-04 NOTE — ED Notes (Signed)
This RN went to [employer] and go over paperwork, but patient was not in room. No vs obtained for dc and paperwork not signed due to patient leaving.

## 2020-02-04 NOTE — ED Notes (Signed)
Pt states that she's been having nv and that she thinks she's pregnant. Pt also states she has a knot on her ribs that is pain on the left side.

## 2020-02-04 NOTE — ED Triage Notes (Signed)
Pt presents to ED with nausea and vomiting in the mornings for the past month+ and pt states she has also missed her period this past month. Pt states she has other kids and thinks she could be pregnant but just wants to make sure. Pt also reports having a knot to her left side over her rib approx quarter sized that is tender to touch. Pt states she did hit the affected area on the corner of a dresser about 2 weeks ago.

## 2021-03-07 ENCOUNTER — Other Ambulatory Visit: Payer: Self-pay

## 2021-03-07 ENCOUNTER — Observation Stay
Admission: EM | Admit: 2021-03-07 | Discharge: 2021-03-09 | Disposition: A | Discharge status: Home / Self Care | Payer: Medicare Other | Attending: Obstetrics and Gynecology | Admitting: Obstetrics and Gynecology

## 2021-03-07 ENCOUNTER — Encounter: Payer: Self-pay | Admitting: Obstetrics & Gynecology

## 2021-03-07 DIAGNOSIS — O99323 Drug use complicating pregnancy, third trimester: Secondary | ICD-10-CM | POA: Diagnosis not present

## 2021-03-07 DIAGNOSIS — O99313 Alcohol use complicating pregnancy, third trimester: Secondary | ICD-10-CM | POA: Diagnosis not present

## 2021-03-07 DIAGNOSIS — O4593 Premature separation of placenta, unspecified, third trimester: Principal | ICD-10-CM | POA: Insufficient documentation

## 2021-03-07 DIAGNOSIS — O26893 Other specified pregnancy related conditions, third trimester: Secondary | ICD-10-CM | POA: Diagnosis present

## 2021-03-07 DIAGNOSIS — F1490 Cocaine use, unspecified, uncomplicated: Secondary | ICD-10-CM | POA: Diagnosis not present

## 2021-03-07 DIAGNOSIS — Z3A33 33 weeks gestation of pregnancy: Secondary | ICD-10-CM

## 2021-03-07 DIAGNOSIS — R109 Unspecified abdominal pain: Secondary | ICD-10-CM | POA: Diagnosis present

## 2021-03-07 DIAGNOSIS — O99333 Smoking (tobacco) complicating pregnancy, third trimester: Secondary | ICD-10-CM | POA: Insufficient documentation

## 2021-03-07 DIAGNOSIS — F17210 Nicotine dependence, cigarettes, uncomplicated: Secondary | ICD-10-CM | POA: Insufficient documentation

## 2021-03-07 DIAGNOSIS — O365930 Maternal care for other known or suspected poor fetal growth, third trimester, not applicable or unspecified: Secondary | ICD-10-CM | POA: Insufficient documentation

## 2021-03-07 DIAGNOSIS — Z5329 Procedure and treatment not carried out because of patient's decision for other reasons: Secondary | ICD-10-CM | POA: Diagnosis not present

## 2021-03-07 DIAGNOSIS — O09523 Supervision of elderly multigravida, third trimester: Secondary | ICD-10-CM | POA: Insufficient documentation

## 2021-03-07 DIAGNOSIS — O133 Gestational [pregnancy-induced] hypertension without significant proteinuria, third trimester: Secondary | ICD-10-CM | POA: Diagnosis not present

## 2021-03-07 DIAGNOSIS — Z3A34 34 weeks gestation of pregnancy: Secondary | ICD-10-CM | POA: Diagnosis not present

## 2021-03-07 DIAGNOSIS — O09293 Supervision of pregnancy with other poor reproductive or obstetric history, third trimester: Secondary | ICD-10-CM

## 2021-03-07 DIAGNOSIS — O10913 Unspecified pre-existing hypertension complicating pregnancy, third trimester: Secondary | ICD-10-CM

## 2021-03-07 DIAGNOSIS — O0933 Supervision of pregnancy with insufficient antenatal care, third trimester: Secondary | ICD-10-CM | POA: Insufficient documentation

## 2021-03-07 DIAGNOSIS — R1030 Lower abdominal pain, unspecified: Secondary | ICD-10-CM | POA: Insufficient documentation

## 2021-03-07 NOTE — OB Triage Note (Signed)
Pt [redacted]w[redacted]d G10G7 presents to [facility] via EMS w/ c/o lower abdominal pain in addition to occasional ctx. Reports no vag bleeding, no LOF, and positive fetal movement. Reports being tested and treated for unknown STD but did not finish prescription. Monitors applied and assessing. BP elevated, will continue to monitor.

## 2021-03-08 ENCOUNTER — Observation Stay: Payer: Medicare Other

## 2021-03-08 DIAGNOSIS — O163 Unspecified maternal hypertension, third trimester: Secondary | ICD-10-CM | POA: Diagnosis not present

## 2021-03-08 DIAGNOSIS — F1490 Cocaine use, unspecified, uncomplicated: Secondary | ICD-10-CM

## 2021-03-08 DIAGNOSIS — O26899 Other specified pregnancy related conditions, unspecified trimester: Secondary | ICD-10-CM | POA: Diagnosis present

## 2021-03-08 DIAGNOSIS — O99891 Other specified diseases and conditions complicating pregnancy: Secondary | ICD-10-CM

## 2021-03-08 DIAGNOSIS — R1030 Lower abdominal pain, unspecified: Secondary | ICD-10-CM | POA: Diagnosis not present

## 2021-03-08 DIAGNOSIS — F1290 Cannabis use, unspecified, uncomplicated: Secondary | ICD-10-CM

## 2021-03-08 DIAGNOSIS — Z3A33 33 weeks gestation of pregnancy: Secondary | ICD-10-CM

## 2021-03-08 DIAGNOSIS — O99323 Drug use complicating pregnancy, third trimester: Secondary | ICD-10-CM | POA: Diagnosis not present

## 2021-03-08 DIAGNOSIS — O4593 Premature separation of placenta, unspecified, third trimester: Secondary | ICD-10-CM | POA: Diagnosis not present

## 2021-03-08 LAB — TYPE AND SCREEN
ABO/RH(D): AB POS
Antibody Screen: NEGATIVE

## 2021-03-08 LAB — CBC
HCT: 30.2 % — ABNORMAL LOW (ref 36.0–46.0)
Hemoglobin: 10.1 g/dL — ABNORMAL LOW (ref 12.0–15.0)
MCH: 30.1 pg (ref 26.0–34.0)
MCHC: 33.4 g/dL (ref 30.0–36.0)
MCV: 90.1 fL (ref 80.0–100.0)
Platelets: 244 10*3/uL (ref 150–400)
RBC: 3.35 MIL/uL — ABNORMAL LOW (ref 3.87–5.11)
RDW: 13.6 % (ref 11.5–15.5)
WBC: 6.9 10*3/uL (ref 4.0–10.5)
nRBC: 0 % (ref 0.0–0.2)

## 2021-03-08 LAB — COMPREHENSIVE METABOLIC PANEL
ALT: 11 U/L (ref 0–44)
AST: 25 U/L (ref 15–41)
Albumin: 3.1 g/dL — ABNORMAL LOW (ref 3.5–5.0)
Alkaline Phosphatase: 93 U/L (ref 38–126)
Anion gap: 7 (ref 5–15)
BUN: 6 mg/dL (ref 6–20)
CO2: 23 mmol/L (ref 22–32)
Calcium: 8.3 mg/dL — ABNORMAL LOW (ref 8.9–10.3)
Chloride: 101 mmol/L (ref 98–111)
Creatinine, Ser: 0.45 mg/dL (ref 0.44–1.00)
GFR, Estimated: >60 mL/min (ref >60)
Glucose, Bld: 99 mg/dL (ref 70–99)
Potassium: 3.5 mmol/L (ref 3.5–5.1)
Sodium: 131 mmol/L — ABNORMAL LOW (ref 135–145)
Total Bilirubin: 1.3 mg/dL — ABNORMAL HIGH (ref 0.3–1.2)
Total Protein: 6.4 g/dL — ABNORMAL LOW (ref 6.5–8.1)

## 2021-03-08 LAB — URINE DRUG SCREEN, QUALITATIVE (ARMC ONLY)
Amphetamines, Ur Screen: NOT DETECTED
Barbiturates, Ur Screen: NOT DETECTED
Benzodiazepine, Ur Scrn: NOT DETECTED
Cannabinoid 50 Ng, Ur ~~LOC~~: POSITIVE — AB
Cocaine Metabolite,Ur ~~LOC~~: POSITIVE — AB
MDMA (Ecstasy)Ur Screen: NOT DETECTED
Methadone Scn, Ur: NOT DETECTED
Opiate, Ur Screen: NOT DETECTED
Phencyclidine (PCP) Ur S: NOT DETECTED
Tricyclic, Ur Screen: NOT DETECTED

## 2021-03-08 LAB — CHLAMYDIA/NGC RT PCR (ARMC ONLY)
Chlamydia Tr: NOT DETECTED
N gonorrhoeae: NOT DETECTED

## 2021-03-08 LAB — URINALYSIS, ROUTINE W REFLEX MICROSCOPIC
Bilirubin Urine: NEGATIVE
Glucose, UA: NEGATIVE mg/dL
Hgb urine dipstick: NEGATIVE
Ketones, ur: 80 mg/dL — AB
Nitrite: NEGATIVE
Protein, ur: 30 mg/dL — AB
Specific Gravity, Urine: 1.03 (ref 1.005–1.030)
pH: 6 (ref 5.0–8.0)

## 2021-03-08 LAB — RPR: RPR Ser Ql: NONREACTIVE

## 2021-03-08 LAB — ETHANOL: Alcohol, Ethyl (B): <10 mg/dL (ref <10)

## 2021-03-08 LAB — PROTEIN / CREATININE RATIO, URINE
Creatinine, Urine: 252 mg/dL
Protein Creatinine Ratio: 0.08 mg/mg{Cre} (ref 0.00–0.15)
Total Protein, Urine: 21 mg/dL

## 2021-03-08 LAB — KLEIHAUER-BETKE STAIN
Fetal Cells %: 0.8 %
Quantitation Fetal Hemoglobin: 0.0039 mL

## 2021-03-08 MED ORDER — LABETALOL HCL 5 MG/ML IV SOLN: 20.0000 mg | INTRAVENOUS | Status: DC | PRN

## 2021-03-08 MED ORDER — LACTATED RINGERS IV SOLN: INTRAVENOUS | Status: DC

## 2021-03-08 MED ORDER — LABETALOL HCL 5 MG/ML IV SOLN: 80.0000 mg | INTRAVENOUS | Status: DC | PRN

## 2021-03-08 MED ORDER — BETAMETHASONE SOD PHOS & ACET 6 (3-3) MG/ML IJ SUSP
12.0000 mg | INTRAMUSCULAR | Status: AC
  Administered 2021-03-08 – 2021-03-09 (×2): 12 mg via INTRAMUSCULAR
  Filled 2021-03-08 (×3): qty 5

## 2021-03-08 MED ORDER — ACETAMINOPHEN-CODEINE #3 300-30 MG PO TABS
1.0000 | ORAL_TABLET | ORAL | Status: DC | PRN
  Administered 2021-03-08 – 2021-03-09 (×9): 1 via ORAL
  Filled 2021-03-08 (×10): qty 1

## 2021-03-08 MED ORDER — ONDANSETRON HCL 4 MG/2ML IJ SOLN: 4.0000 mg | Freq: Four times a day (QID) | INTRAMUSCULAR | Status: DC | PRN

## 2021-03-08 MED ORDER — BUTORPHANOL TARTRATE 1 MG/ML IJ SOLN: 1.0000 mg | INTRAMUSCULAR | Status: DC

## 2021-03-08 MED ORDER — BUTORPHANOL TARTRATE 1 MG/ML IJ SOLN
1.0000 mg | INTRAMUSCULAR | Status: DC | PRN
Start: 2021-03-08 — End: 2021-03-08
  Administered 2021-03-08 (×5): 1 mg via INTRAVENOUS
  Filled 2021-03-08 (×5): qty 1

## 2021-03-08 MED ORDER — LACTATED RINGERS IV BOLUS
500.0000 mL | Freq: Once | INTRAVENOUS | Status: AC
  Administered 2021-03-08: 500 mL via INTRAVENOUS

## 2021-03-08 MED ORDER — LIDOCAINE HCL (PF) 1 % IJ SOLN: 30.0000 mL | INTRAMUSCULAR | Status: DC | PRN

## 2021-03-08 MED ORDER — HYDRALAZINE HCL 20 MG/ML IJ SOLN: 10.0000 mg | INTRAMUSCULAR | Status: DC | PRN

## 2021-03-08 MED ORDER — ACETAMINOPHEN 325 MG PO TABS: 650.0000 mg | ORAL_TABLET | ORAL | Status: DC | PRN

## 2021-03-08 MED ORDER — LABETALOL HCL 5 MG/ML IV SOLN: 40.0000 mg | INTRAVENOUS | Status: DC | PRN

## 2021-03-08 NOTE — Progress Notes (Signed)
Pt off the monitor at 0720. RN went to room at 0725 and pt was not in room. Pt found found in hall walking. She states she needed to take a walk and get off her back. Pt waked back to room by RN

## 2021-03-08 NOTE — Progress Notes (Signed)
RN received phone call from [facility] at 03:11AM about preliminary positive KB stain results. RN relayed info to MD, awaiting final results.

## 2021-03-08 NOTE — Progress Notes (Signed)
BMZ ordered this AM by Dr. Tiburcio Pea. RN educated pt on BMZ and pt refused injection. Pt states she understands reason but continues to refuse. MD notified. MD went to bedside and said pt agreed to have BMZ injection when she returned from [place]. Pt returned from [place] wanting pain meds and lunch. Dr. Jerene Pitch in department notified. Dr Jerene Pitch and Jae Dire CNM discussed POC at bedside. Pt in agreement for BMZ after she eats lunch. RN at bedside after lunch to give BMZ and pt refuses. Pt educated and states she will get it after she takes a nap.

## 2021-03-08 NOTE — H&P (Signed)
Obstetrics Admission History & Physical   CC: Lower abdominal pains  HPI:  35 y.o. [id number] @ [redacted]w[redacted]d (04/20/2021, by Last Menstrual Period). Admitted on 03/07/2021:   Patient Active Problem List   Diagnosis Date Noted   Abdominal pain affecting pregnancy 03/08/2021   Severe preeclampsia 09/11/2019   Premature rupture of membranes 09/07/2019   Back pain affecting pregnancy 08/27/2019   Preterm uterine contractions in third trimester, antepartum 08/03/2019   Cocaine substance abuse (HCC) 04/09/2015    Presents for 24 hour h/o worsening lower abdominal pains, has ctx like pains also, and each pain is separate but equal in intensity.  She has no VB or ROM.  No n/v/d/c/f/c.  Recent cocaine use (she says 1 week ago) and recent EtOH use (yesterday).  Treated for trich recently, although did not finish therapy.   Prenatal care at: at another place. Pregnancy complicated by  grand multiparity, history of substance use and dependance , prior preeclampsia, limited PNC.  ROS: A review of systems was performed and negative, except as stated in the above HPI. PMHx:  Past Medical History:  Diagnosis Date   Advanced maternal age in multigravida    Substance abuse affecting pregnancy, antepartum    cocaine, marijuana   Tobacco use affecting pregnancy, antepartum    PSHx:  Past Surgical History:  Procedure Laterality Date   NO PAST SURGERIES     Medications:  Medications Prior to Admission  Medication Sig Dispense Refill Last Dose   acetaminophen (TYLENOL) 325 MG tablet Take 2 tablets (650 mg total) by mouth every 4 (four) hours as needed for mild pain, moderate pain or headache (for pain scale < 4). 60 tablet 1 03/07/2021   amLODipine (NORVASC) 10 MG tablet Take 1 tablet (10 mg total) by mouth daily. 30 tablet 2    ferrous sulfate 325 (65 FE) MG tablet Take 1 tablet (325 mg total) by mouth daily with breakfast. 30 tablet 3    Prenatal Vit-Fe Fumarate-FA (MULTIVITAMIN-PRENATAL) 27-0.8 MG TABS tablet  Take 1 tablet by mouth daily at 12 noon.      Allergies: has No Known Allergies. OBHx:  OB History  Gravida Para Term Preterm AB Living  11 8 6   2 8   SAB IAB Ectopic Multiple Live Births        0 8    # Outcome Date GA Lbr Len/2nd Weight Sex Delivery Anes PTL Lv  11 Current           10 Term 09/08/19 [redacted]w[redacted]d / 00:02 2450 g F Vag-Spont None  LIV  9 Para 09/30/16   3330 g M Vag-Spont None  LIV  8 Para 04/09/15  10:39 / 00:29 3350 g M Vag-Spont EPI  LIV     Birth Comments: Infant has extra digit on both hands  7 Term 2014    F Vag-Spont   LIV  6 Term 2012    F Vag-Spont   LIV  5 Term 2010    F Vag-Spont   LIV  4 Term 2008    M Vag-Spont   LIV  3 Term 2004    F Vag-Spont   LIV  2 AB      SAB     1 AB      SAB      09-25-1986 except as detailed in HPI.LHT:DSKAJGOT/LXBWIOMBTDHR  No family history of birth defects. Soc Hx: Current smoker, Alcohol: in pregnancy: recent, and Recreational drug use: current: Cocaine  Objective:   Vitals:  03/07/21 2347 03/08/21 0005  BP: (!) 156/94 (!) 165/89  Pulse: 83 87   Constitutional: Well nourished, well developed female in no acute distress.  HEENT: normal Skin: Warm and dry.  Cardiovascular:Regular rate and rhythm.   Extremity: trace to 1+ bilateral pedal edema Respiratory: Clear to auscultation bilateral. Normal respiratory effort Abdomen: gravid, ND, FHT present, mild tenderness on exam Back: no CVAT Neuro: DTRs 2+, Cranial nerves grossly intact Psych: Alert and Oriented x3. No memory deficits. Normal mood and affect.  MS: normal gait, normal bilateral lower extremity ROM/strength/stability.  Pelvic exam: is not limited by body habitus EGBUS: within normal limits Vagina: within normal limits and with normal mucosa Cervix: EXTERNAL GENITALIA: normal appearing vulva with no masses, tenderness or lesions CERVIX: 0 cm dilated, 30 effaced, -3 station Uterus: Spontaneous uterine activity  Adnexa: not evaluated  EFM:FHR: 140 bpm, variability:  moderate,  accelerations:  Present,  decelerations:  Absent Toco: Frequency: Every 5-9 minutes  Assessment & Plan:   35 y.o. [id number] @ [redacted]w[redacted]d, Admitted on 03/07/2021:Lower Abdominal pain No cervical dilation Risk of drug use as etiology Higher blood pressure, preeclampsia vs drug use Cont fetal monitoring Labs    Annamarie Major, MD, [facility], [facility] 03/08/2021  12:23 AM

## 2021-03-08 NOTE — Progress Notes (Signed)
RN discussed giving BMZ again. Pt states "I will get it but not right now"

## 2021-03-08 NOTE — Progress Notes (Signed)
Pt taken via wheelchair to ultrasound by transport at [facility]

## 2021-03-08 NOTE — Progress Notes (Signed)
Spoke with Paige Pierce with her nurse Shanda Bumps and midwife Dawson Bills present in the room.  Patient was sitting upright in bed.  She appeared comfortable and was eating a bag of chips.  She reported that she had been having severe abdominal pain throughout the night.  She reports that she did not sleep well.  Her pain started yesterday and brought her into the hospital.  She denies vaginal bleeding.  She noted 1 pain incidents while I was in the room and this was during a uterine contraction which graft on the monitor.  She is just returned from ultrasound where fetal anatomy was noted to be normal.  However her baby is found to have intrauterine growth restriction.  Growth percentile was 5%.  Normal amniotic fluid levels.  Placenta did not show evidence of abruption.  Patient has been receiving a large amount of IV Stadol with at least 5 doses overnight.  She would like to continue to receive medication for pain management.  We discussed transitioning to an oral tablet which she can take every 4 hours and she was open to this.  Tylenol 3 was ordered every 4 hours.  Comfortable with the patient having a regular diet at this time as immediate delivery is not planned.  We discussed the results of her Kleihauer-Betke test which showed fetal blood in the maternal circulation suggestive of abruption.  Fetal heart tones have been reassuring and uterine contractions are intermittent at this time.  We will continue to closely monitor her for signs of abruption or fetal distress.  Discussed continued observation with continuous fetal monitoring at this time.  New diagnosis of intrauterine growth restriction.  We will plan for consultation with maternal-fetal medicine tomorrow and UA Dopplers.  Maternal elevated blood pressures.  Assumption has been that this could be related to her cocaine usage, however other etiologies such as chronic hypertension, gestational hypertension, and preeclampsia must also be considered.   Initial labs not suggestive of preeclampsia.  Patient does report a mild headache but notes that she has not eaten in many hours and did not sleep overnight.  As the patient to let us know if her headache is not improving in the afternoon after Tylenol, eating and rest.  If needed can treat severe blood pressures with IV labetalol.  We discussed the benefits to baby of receiving betamethasone if for some reason delivery was required related to possible abruption or other indications.  She was open to this and elected to receive betamethasone today.  Paige Pierce expressed that she is very concerned about her pregnancy and wants to do everything for the best possible outcome.  For this reason she is very agreeable with our plan of care and with continued observation.  Adelene Idler MD, [facility], [facility] 03/08/2021 12:02 PM

## 2021-03-08 NOTE — Progress Notes (Addendum)
RN at bedside for >18min at a time adjusting [place]. Fetal movement audible, FHT's audible, however unable to pick up due to constant fetal movement. Will continue to adjust.

## 2021-03-09 ENCOUNTER — Other Ambulatory Visit: Payer: Self-pay

## 2021-03-09 ENCOUNTER — Observation Stay (HOSPITAL_BASED_OUTPATIENT_CLINIC_OR_DEPARTMENT_OTHER): Payer: Medicare Other

## 2021-03-09 DIAGNOSIS — F17210 Nicotine dependence, cigarettes, uncomplicated: Secondary | ICD-10-CM | POA: Diagnosis present

## 2021-03-09 DIAGNOSIS — O26893 Other specified pregnancy related conditions, third trimester: Secondary | ICD-10-CM | POA: Diagnosis not present

## 2021-03-09 DIAGNOSIS — O365930 Maternal care for other known or suspected poor fetal growth, third trimester, not applicable or unspecified: Secondary | ICD-10-CM

## 2021-03-09 DIAGNOSIS — R1030 Lower abdominal pain, unspecified: Secondary | ICD-10-CM | POA: Diagnosis present

## 2021-03-09 DIAGNOSIS — O99333 Smoking (tobacco) complicating pregnancy, third trimester: Secondary | ICD-10-CM | POA: Diagnosis present

## 2021-03-09 DIAGNOSIS — Z3A34 34 weeks gestation of pregnancy: Secondary | ICD-10-CM | POA: Diagnosis not present

## 2021-03-09 DIAGNOSIS — O99313 Alcohol use complicating pregnancy, third trimester: Secondary | ICD-10-CM

## 2021-03-09 DIAGNOSIS — Z3A33 33 weeks gestation of pregnancy: Secondary | ICD-10-CM | POA: Diagnosis not present

## 2021-03-09 DIAGNOSIS — F1490 Cocaine use, unspecified, uncomplicated: Secondary | ICD-10-CM | POA: Diagnosis not present

## 2021-03-09 DIAGNOSIS — O99323 Drug use complicating pregnancy, third trimester: Secondary | ICD-10-CM | POA: Diagnosis not present

## 2021-03-09 DIAGNOSIS — F1010 Alcohol abuse, uncomplicated: Secondary | ICD-10-CM

## 2021-03-09 DIAGNOSIS — O4593 Premature separation of placenta, unspecified, third trimester: Secondary | ICD-10-CM

## 2021-03-09 DIAGNOSIS — O09523 Supervision of elderly multigravida, third trimester: Secondary | ICD-10-CM | POA: Diagnosis not present

## 2021-03-09 DIAGNOSIS — Z7289 Other problems related to lifestyle: Secondary | ICD-10-CM

## 2021-03-09 DIAGNOSIS — O133 Gestational [pregnancy-induced] hypertension without significant proteinuria, third trimester: Secondary | ICD-10-CM | POA: Diagnosis present

## 2021-03-09 DIAGNOSIS — R109 Unspecified abdominal pain: Secondary | ICD-10-CM | POA: Diagnosis not present

## 2021-03-09 DIAGNOSIS — Z5329 Procedure and treatment not carried out because of patient's decision for other reasons: Secondary | ICD-10-CM | POA: Diagnosis present

## 2021-03-09 DIAGNOSIS — O0933 Supervision of pregnancy with insufficient antenatal care, third trimester: Secondary | ICD-10-CM | POA: Diagnosis not present

## 2021-03-09 NOTE — Progress Notes (Unsigned)
Pt transported back to [facility] following her appt today at [facility] @ 1518.  Report given to Herma Carson, RN inclduing pt vitals.  MFM final report will be in Epic.

## 2021-03-09 NOTE — Consult Note (Signed)
MFM Note  Paige Pierce is a 35 year old gravida 11 para 8-0-2-8 currently at 34 weeks and 0 days.  She was admitted yesterday due to severe sudden onset abdominal pain.  The patient reports that the pain is constant and occurs in the lower part of her abdomen where her uterus is.  She rates the pain as 8-9 out of 10.  She has required pain medication constantly every 4 hours due to the severe abdominal pain.    On admission, the patient's urine toxicology screen was positive for cocaine and cannabis.  The patient admits to smoking cocaine prior to admission and she has been drinking about one can of beer daily throughout her pregnancy.  The patient had a positive Kleihauer-Betke test on admission showing 0.0039 mL of fetal blood.  Her blood type is AB+.  Her antibody screen was negative.  The patient denies any recent falls or trauma to her abdomen.    She has been kept on continuous monitoring since being admitted and has had a reactive fetal heart rate tracing.  The patient's blood pressures since admission have been elevated in the 130s to 140s over 70s to 100 range.  Her PIH labs were within normal limits.  She has ruled out for preeclampsia as her P/C ratio showed only 21 mg of protein.  Due to concerns regarding placental abruption, she is receiving a complete course of antenatal corticosteroids.  The patient had an ultrasound performed by radiology yesterday that showed an EFW of 1859 g which measures at the 5th percentile for her gestational age indicating fetal growth restriction.  There was normal amniotic fluid noted on that exam.  The patient has a history of 8 prior vaginal deliveries.  She denies any history of hypertension and denies any significant past medical history.  A limited ultrasound performed today shows that the fetus is in the vertex presentation.  There was normal amniotic fluid noted.   Doppler studies of the umbilical arteries performed today due to fetal growth  restriction showed a normal S/D ratio of 2.09.  There were no signs of absent or reversed end-diastolic flow.  A normal-appearing fundal/posterior placenta was noted today.  There were no retroplacental clots noted behind the placenta.  The patient was advised that not all cases of placental abruption can be diagnosed via ultrasound.  The patient was advised regarding my concern that the cause of her severe abdominal pain is due to placental abruption from cocaine use given that she did have a positive Kleihauer-Betke test.  Due to this concern, she should be kept on continuous monitoring for now.  She should receive the second dose of antenatal corticosteroids later this evening.    Should her severe abdominal pain persist once she completes her steroid course, delivery should be considered in order to avoid an adverse pregnancy outcome such as a fetal demise.  The patient was reassured that the neonatal outcomes for delivery at 34+ weeks is generally good.  She understands that there is no safe amount of alcohol to drink during pregnancy.  Her baby should be examined after birth for signs of the fetal alcohol syndrome due to her chronic alcohol consumption during pregnancy.  She was encouraged to discontinue cocaine use.  The patient stated that all her questions had been answered to her complete satisfaction.  A total of 70 minutes was spent counseling and coordinating the care for this patient.  Recommendations:  Continuous monitoring Administer the second dose of antenatal corticosteroids this evening Delivery  if her severe abdominal pain persists once she has completed her steroid course

## 2021-03-09 NOTE — Progress Notes (Signed)
Pt reports that she feels better then before and she would like to go home. Pt was educated on her need to continue care. Pt stated that she understands why she needs to stay however she still wants to go home. RN informed pt that provider will need to be contacted, pt stated that was fine. Provider was contacted and spoke with the pt face to face. Pt stated that she understood that if any problems occur she will come right back to the hospital.

## 2021-03-09 NOTE — Progress Notes (Signed)
Pt transported via wheelchair to MFM clinic for Ultrasound.

## 2021-03-09 NOTE — Progress Notes (Signed)
Pt called out asking for pain medication. RN went to bedside to evaluate and patient was sleeping. When RN asked what her pain level was and she stated 9/10. Pt states the pain is going across her mid abdomen and is constant stabbing pain. Pt is ringing out for pain medication exactly every 4 hours and has constantly rated her pain 9-10/10 scale. Pt immediately went back to sleep after taking her medication.

## 2021-03-09 NOTE — Progress Notes (Signed)
Pt sleeping. RN at bedside to adjust EFM.

## 2021-03-09 NOTE — Progress Notes (Signed)
Pt back from MFM clinic. RN at bedside to apply monitors.

## 2021-03-09 NOTE — Discharge Summary (Addendum)
DC Summary Discharge Summary   Patient ID: Paige Pierce [id number] 35 y.o. 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 [id number] female who presents for 24 hour h/o worsening lower abdominal pains, has ctx like pains also, and each pain is separate but equal in intensity.  She has no VB or ROM.  No n/v/d/c/f/c.  Recent cocaine use (she says 1 week ago) and recent EtOH use (yesterday).  Treated for trich recently, although did not finish therapy.   Prenatal care at: at another place. Pregnancy complicated by  grand multiparity, history of substance use and dependance , prior preeclampsia, limited PNC.  Past Medical History:  Diagnosis Date   Advanced maternal age in multigravida    Substance abuse affecting pregnancy, antepartum    cocaine, marijuana   Tobacco use affecting pregnancy, antepartum     Past Surgical History:  Procedure Laterality Date   NO PAST SURGERIES      No Known Allergies  Social History   Tobacco Use   Smoking status: Every Day    Packs/day: 0.25    Pack years: 0.00    Types: Cigarettes   Smokeless tobacco: Never  Vaping Use   Vaping Use: Never used  Substance Use Topics   Alcohol use: Yes    Alcohol/week: 3.0 standard drinks    Types: 3 Shots of liquor per week    Comment: every now and then   Drug use: Not Currently    Types: Marijuana, Cocaine    Family History  Problem Relation Age of Onset   Cancer Mother     Hospital Course:  The patient was  admitted for observation initially. She required nearly constant medication for abdominal pain throughout the first day.  She was eventually switched to tylenol #3.  She had a positive KB test, which suggests a placental abruption. She had an ultrasound that showed fetal growth restriction with normal fluid and a normal umbilical artery doppler study.  The MFM discussed with me directly that once she reached 48 hours from her first dose of BMTZ that she should be delivered.  She did receive both doses of BMTZ (second dose on 6/14 at about 745pm).  At this point, she decided that she would like to leave. She stated that her abdominal pain had gone away completely and she was going to go home one way or another. I had a discussion with her regarding the risk of fetal death, maternal death, or serious injury to both, if she left without delivery.  She was also given precautions for headache, visual changes, RUQ pain and any other symptoms.  Delivery really should be strongly considered in this patient once she is beta complete.  She stated that she will return tomorrow for a BP check, NST, and delivery planning.  She stated that she understood these risks and still wants to leave AGAINST MEDICAL ADVICE.   Discharge Exam: BP (!) 150/88 (BP Location: Left Arm)   Pulse 87   Temp 98.7 F (37.1 C) (Oral)   Resp 17   Ht 5\' 4"  (1.626 m)  Wt 61.2 kg   LMP 07/14/2020 (Approximate)   BMI 23.17 kg/m  Physical Exam Constitutional:      General: She is not in acute distress.    Appearance: Normal appearance.  HENT:     Head: Normocephalic and atraumatic.  Eyes:     General: No scleral icterus.    Conjunctiva/sclera: Conjunctivae normal.  Abdominal:     Palpations: There is mass (GRAVID).  Neurological:     General: No focal deficit present.     Mental Status: She is alert and oriented to person, place, and time.     Cranial Nerves: No cranial nerve deficit.  Psychiatric:        Mood and Affect: Mood  normal.        Behavior: Behavior normal.        Judgment: Judgment normal.     Condition at Discharge: Stable  Complications affecting treatment: None  Discharge Medications:  Allergies as of 03/09/2021   No Known Allergies      Medication List     STOP taking these medications    acetaminophen 325 MG tablet Commonly known as: Tylenol   amLODipine 10 MG tablet Commonly known as: NORVASC       TAKE these medications    ferrous sulfate 325 (65 FE) MG tablet Take 1 tablet (325 mg total) by mouth daily with breakfast.   multivitamin-prenatal 27-0.8 MG Tabs tablet Take 1 tablet by mouth daily at 12 noon.         Follow-up arrangements: tomorrow on L&D, to be scheduled prior to her departure.     Discharge Disposition: Discharge disposition: 01-Home or Self Care  Left AGAINST MEDICAL ADVICE  A total of 45 minutes were spent face-to-face with the patient as well as preparation, review, communication, and documentation during this encounter.    Signed: Thomasene Mohair, MD  03/09/2021 7:57 PM

## 2021-03-10 LAB — PTT FACTOR INHIBITOR (MIXING STUDY): aPTT: 24.8 s (ref 22.9–30.2)

## 2021-03-10 LAB — PT FACTOR INHIBITOR (MIXING STUDY)
PT 1:1NP: 10.5 s (ref 9.1–12.0)
PT: 10.5 s (ref 9.1–12.0)

## 2022-01-11 ENCOUNTER — Emergency Department: Payer: Medicaid Other

## 2022-01-11 ENCOUNTER — Emergency Department
Admission: EM | Admit: 2022-01-11 | Discharge: 2022-01-11 | Disposition: A | Discharge status: Home / Self Care | Payer: Medicaid Other | Attending: Emergency Medicine | Admitting: Emergency Medicine

## 2022-01-11 DIAGNOSIS — M25512 Pain in left shoulder: Secondary | ICD-10-CM | POA: Diagnosis present

## 2022-01-11 DIAGNOSIS — R0781 Pleurodynia: Secondary | ICD-10-CM | POA: Insufficient documentation

## 2022-01-11 DIAGNOSIS — Y92410 Unspecified street and highway as the place of occurrence of the external cause: Secondary | ICD-10-CM | POA: Diagnosis not present

## 2022-01-11 MED ORDER — HYDROCODONE-ACETAMINOPHEN 5-325 MG PO TABS: 1.0000 | ORAL_TABLET | ORAL | 0 refills | Status: AC | PRN

## 2022-01-11 MED ORDER — HYDROCODONE-ACETAMINOPHEN 5-325 MG PO TABS
1.0000 | ORAL_TABLET | Freq: Once | ORAL | Status: AC
  Administered 2022-01-11: 1 via ORAL
  Filled 2022-01-11: qty 1

## 2022-01-11 MED ORDER — OXYCODONE-ACETAMINOPHEN 5-325 MG PO TABS
1.0000 | ORAL_TABLET | ORAL | Status: DC | PRN
  Administered 2022-01-11: 1 via ORAL
  Filled 2022-01-11: qty 1
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
## 2023-05-25 IMAGING — US US MFM OB LIMITED
2 series · 13 of 28 positions shown · non-contrast
Comparison: none

[Series 1: us mfm ob limited · 6 of 19 slices shown (1 of 2)]
[im 2/19]
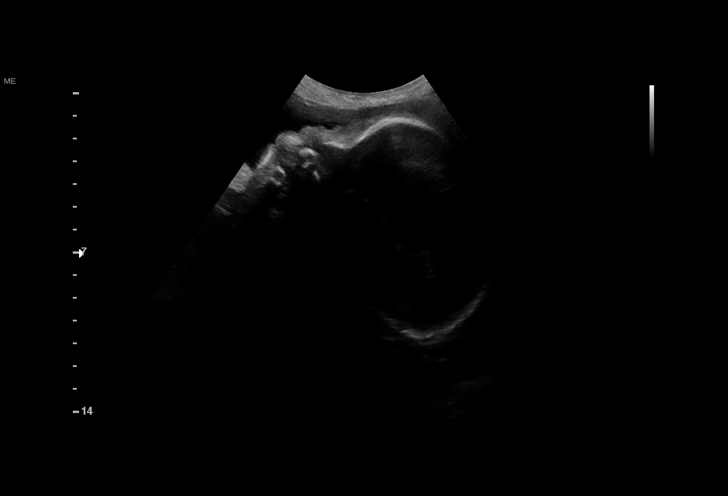
[im 5/19]
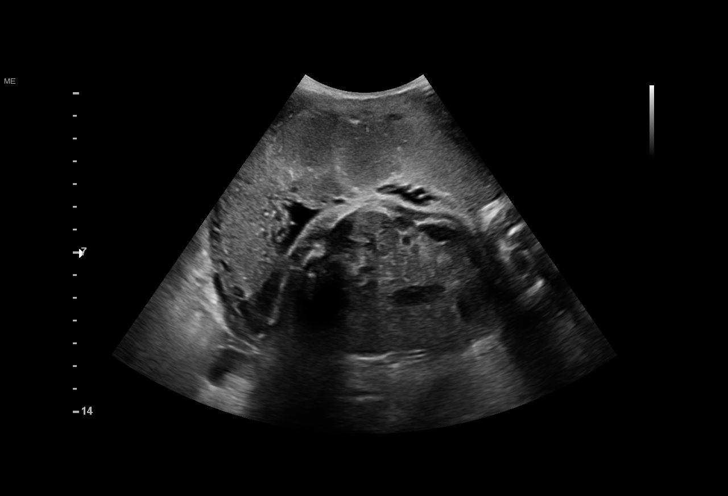
[im 7/19]
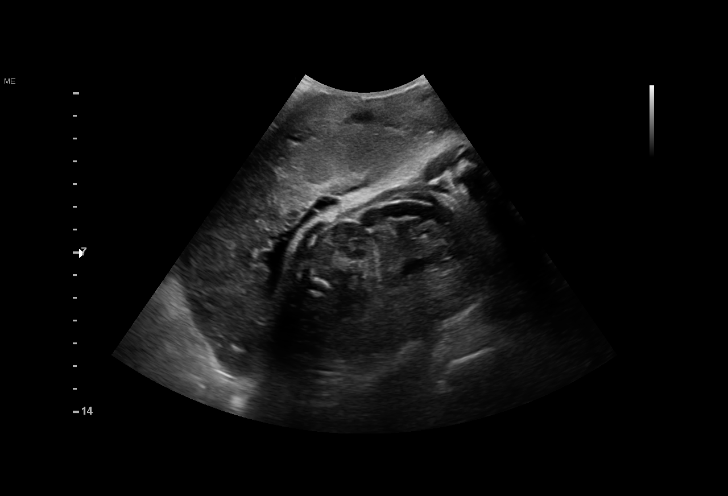
[im 10/19]
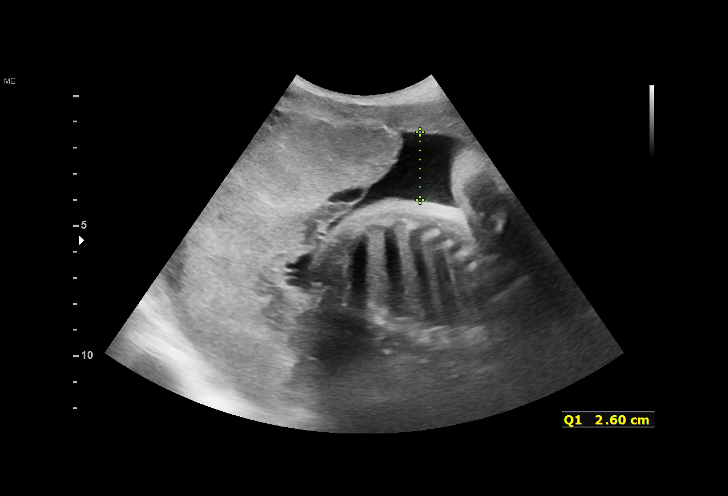
[im 13/19]
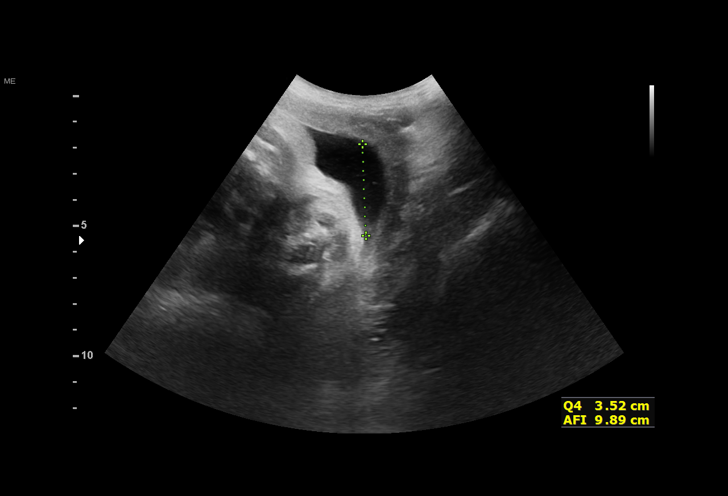
[im 16/19]
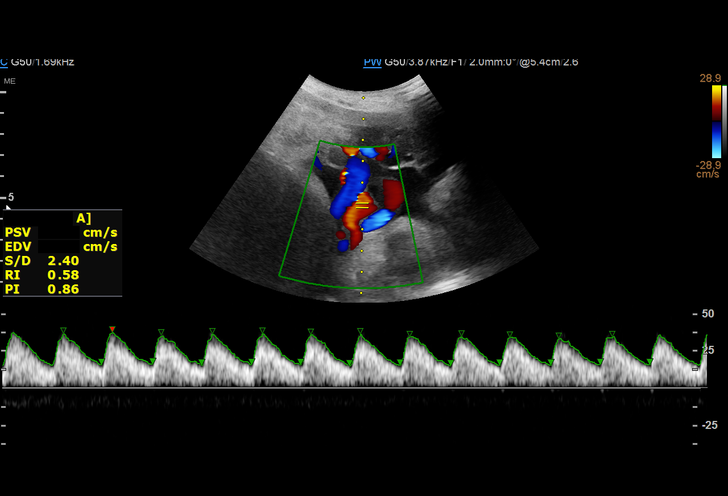

[Series 3: us mfm ob limited · 20 acquisitions, 7 frames shown (2 of 2)]
[im 1/20]
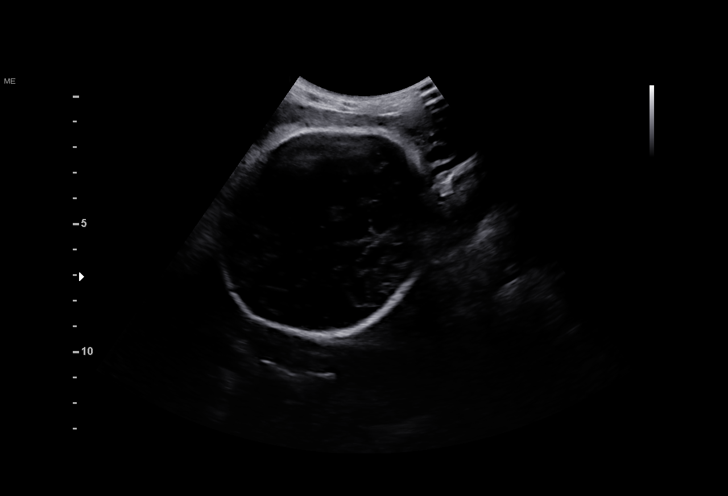
[im 3/20]
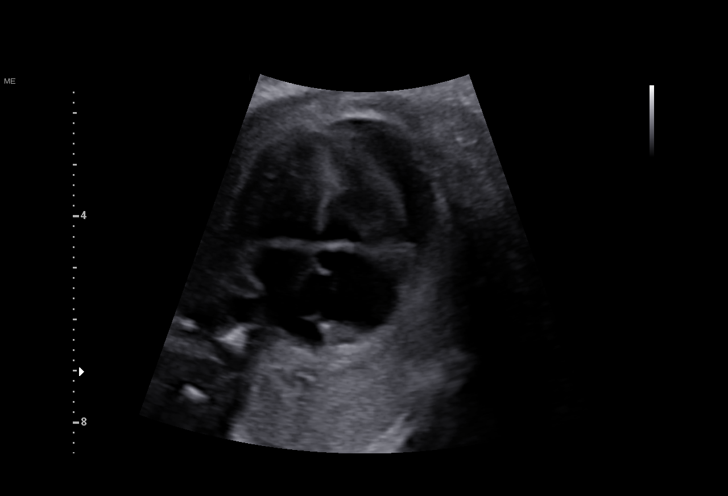
[im 6/20]
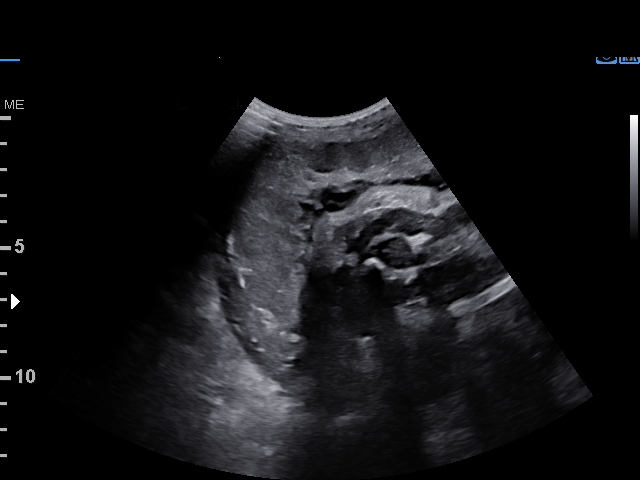
[im 9/20]
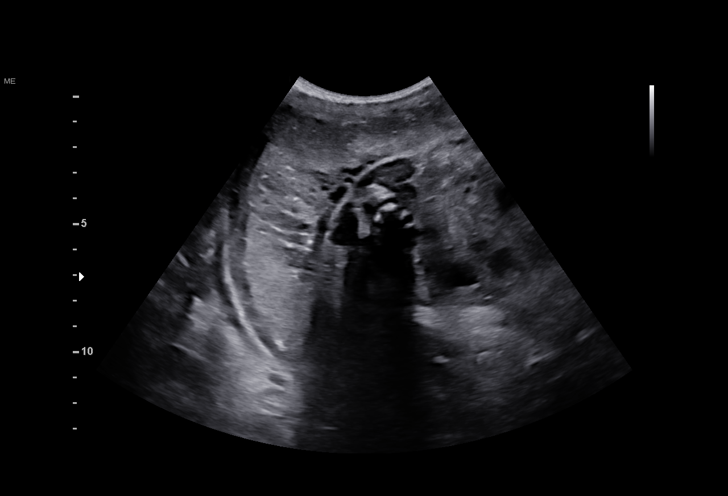
[im 12/20]
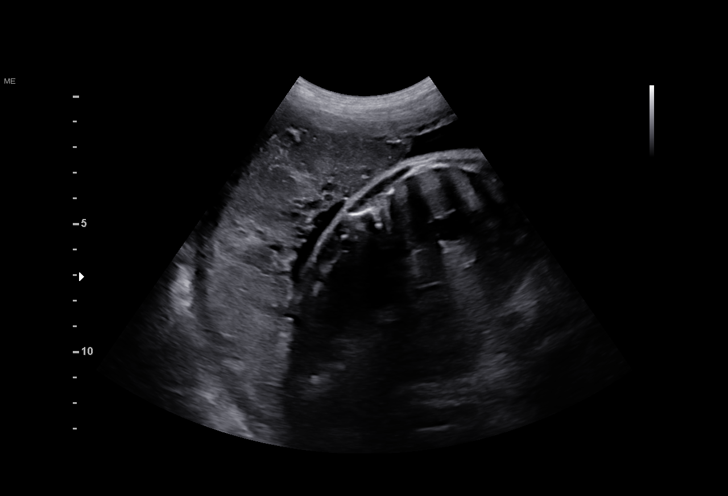
[im 15/20]
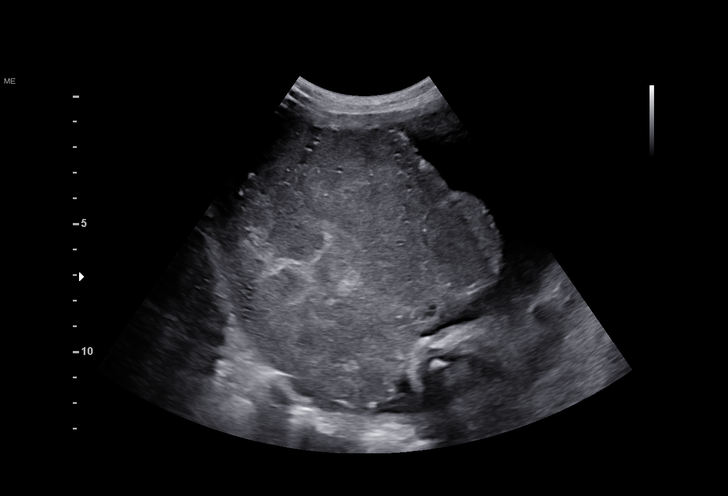
[im 18/20]
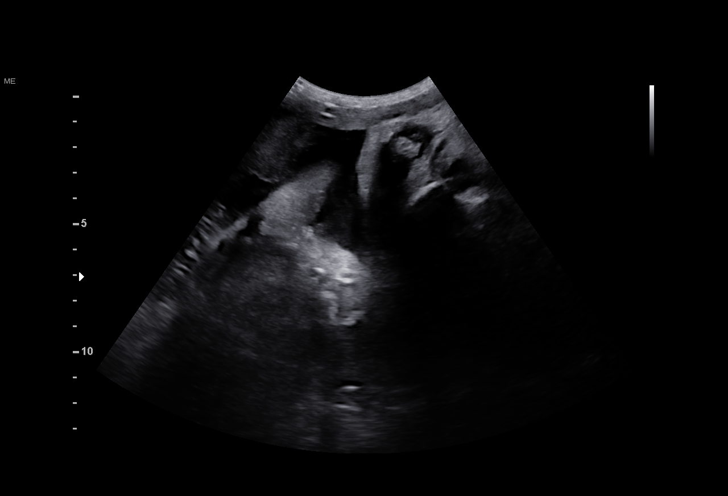

[13 of 28 positions shown; findings below may reference images not displayed]

1  US MFM UA CORD DOPPLER                76820.02    JUTIKA GARODIA
 2  US MFM OB LIMITED                     76815.01    JUTIKA GARODIA

Indications

 Maternal care for known or suspected poor
 fetal growth, third trimester, fetus 2 IUGR
 Hypertension - Chronic/Pre-existing
 34 weeks gestation of pregnancy
 Advanced maternal age multigravida 35+,
 third trimester
 Cocaine abuse
 Alcohol use complicating pregnancy, third
 trimester
Fetal Evaluation

 Num Of Fetuses:         1
 Fetal Heart Rate(bpm):  141
 Cardiac Activity:       Observed
 Presentation:           Cephalic
 Placenta:               Fundal

 AFI Sum(cm)     %Tile       Largest Pocket(cm)
 9.9             18

 RUQ(cm)       RLQ(cm)       LUQ(cm)        LLQ(cm)

Gestational Age
 LMP:           34w 0d        Date:  07/14/20                 EDD:   04/20/21
 Best:          34w 0d     Det. By:  LMP  (07/14/20)          EDD:   04/20/21
Anatomy

 Stomach:               Appears normal, left   Bladder:                Appears normal
                        sided
 Kidneys:               Appear normal
Doppler - Fetal Vessels

 Umbilical Artery
  S/D     %tile      RI    %tile                             ADFV    RDFV
  2.09       21    0.52       18                                No      No

Comments

 Hon Leong Lann is a 35-year-old gravida 11 para [DATE] currently
 at 34 weeks and 0 days.  She was admitted yesterday due to
 severe sudden onset abdominal pain.  The patient reports
 that the pain is constant and occurs in the lower part of her
 abdomen where her uterus is.  She rates the pain as [DATE].  She has required pain medication constantly every 4
 hours due to the severe abdominal pain.
 On admission, the patient's urine toxicology screen was
 positive for cocaine and cannabis.  The patient admits to
 smoking cocaine prior to admission and she has been
 drinking about one can of Michaell Bisbal throughout her
 pregnancy.  The patient had a positive Rohullahamin test
 on admission showing 0.0039 mL of fetal blood.  Her blood
 type is AB+.  Her antibody screen was negative.  The patient
 denies any recent falls or trauma to her abdomen.
 She has been kept on continuous monitoring since being
 admitted and has had a reactive fetal heart rate tracing.  The
 patient's blood pressures since admission have been
 elevated in the 130s to 140s over 70s to 100 range.  Her PIH
 labs were within normal limits.  She has ruled out for
 preeclampsia as her P/C ratio showed only 21 mg of protein.
 Due to concerns regarding placental abruption, she is
 receiving a complete course of antenatal corticosteroids.
 The patient had an ultrasound performed by radiology
 yesterday that showed an EFW of 1402 g which measures at
 the 5th percentile for her gestational age indicating fetal
 growth restriction.  There was normal amniotic fluid noted on
 that exam.
 The patient has a history of 8 prior vaginal deliveries.
 She denies any history of hypertension and denies any
 significant past medical history.
 A limited ultrasound performed today shows that the fetus is
 in the vertex presentation.  There was normal amniotic fluid
 noted.
 Doppler studies of the umbilical arteries performed today due
 to fetal growth restriction showed a normal S/D ratio of 2.09.
 There were no signs of absent or reversed end-diastolic flow.
 A normal-appearing fundal/posterior placenta was noted
 today.  There were no retroplacental clots noted behind the
 placenta.  The patient was advised that not all cases of
 placental abruption can be diagnosed via ultrasound.
 The patient was advised regarding my concern that the cause
 of her severe abdominal pain is due to placental abruption
 from cocaine use given that she did have a positive Nii Akwei
 Paulus N test.  Due to this concern, she should be kept on
 continuous monitoring for now.  She should receive the
 second dose of antenatal corticosteroids later this evening.
 Should her severe abdominal pain persist once she
 completes her steroid course, delivery should be considered
 in order to avoid an adverse pregnancy outcome such as a
 fetal demise.  The patient was reassured that the neonatal
 outcomes for delivery at 34+ weeks is generally good.  She
 understands that there is no safe amount of alcohol to drink
 during pregnancy.  Her baby should be examined after birth
 for signs of the fetal alcohol syndrome due to her chronic
 alcohol consumption during pregnancy.  She was
 encouraged to discontinue cocaine use.
 The patient stated that all her questions had been answered
 to her complete satisfaction.
 A total of 70 minutes was spent counseling and coordinating
 the care for this patient.
 Recommendations:
 Continuous monitoring
 Administer the second dose of antenatal corticosteroids this
 evening
 Delivery if her severe abdominal pain persists once she has
 completed her steroid course

## 2023-07-31 ENCOUNTER — Other Ambulatory Visit: Payer: Self-pay

## 2023-07-31 ENCOUNTER — Emergency Department
Admission: EM | Admit: 2023-07-31 | Discharge: 2023-07-31 | Disposition: A | Discharge status: Home / Self Care | Payer: Medicare Other | Attending: Emergency Medicine | Admitting: Emergency Medicine

## 2023-07-31 ENCOUNTER — Emergency Department: Payer: Medicare Other

## 2023-07-31 ENCOUNTER — Encounter: Payer: Self-pay | Admitting: Emergency Medicine

## 2023-07-31 DIAGNOSIS — R5383 Other fatigue: Secondary | ICD-10-CM | POA: Insufficient documentation

## 2023-07-31 DIAGNOSIS — R000 Tachycardia, unspecified: Secondary | ICD-10-CM | POA: Diagnosis not present

## 2023-07-31 DIAGNOSIS — R519 Headache, unspecified: Secondary | ICD-10-CM | POA: Insufficient documentation

## 2023-07-31 DIAGNOSIS — R55 Syncope and collapse: Secondary | ICD-10-CM | POA: Diagnosis not present

## 2023-07-31 DIAGNOSIS — R112 Nausea with vomiting, unspecified: Secondary | ICD-10-CM | POA: Insufficient documentation

## 2023-07-31 DIAGNOSIS — Z20822 Contact with and (suspected) exposure to covid-19: Secondary | ICD-10-CM | POA: Diagnosis not present

## 2023-07-31 DIAGNOSIS — E860 Dehydration: Secondary | ICD-10-CM

## 2023-07-31 DIAGNOSIS — R531 Weakness: Secondary | ICD-10-CM | POA: Insufficient documentation

## 2023-07-31 DIAGNOSIS — R197 Diarrhea, unspecified: Secondary | ICD-10-CM | POA: Insufficient documentation

## 2023-07-31 LAB — URINALYSIS, COMPLETE (UACMP) WITH MICROSCOPIC
Bilirubin Urine: NEGATIVE
Glucose, UA: NEGATIVE mg/dL
Hgb urine dipstick: NEGATIVE
Ketones, ur: NEGATIVE mg/dL
Leukocytes,Ua: NEGATIVE
Nitrite: NEGATIVE
Protein, ur: NEGATIVE mg/dL
Specific Gravity, Urine: 1.023 (ref 1.005–1.030)
pH: 5 (ref 5.0–8.0)

## 2023-07-31 LAB — COMPREHENSIVE METABOLIC PANEL
ALT: 43 U/L (ref 0–44)
AST: 46 U/L — ABNORMAL HIGH (ref 15–41)
Albumin: 4.7 g/dL (ref 3.5–5.0)
Alkaline Phosphatase: 61 U/L (ref 38–126)
Anion gap: 13 (ref 5–15)
BUN: 6 mg/dL (ref 6–20)
CO2: 27 mmol/L (ref 22–32)
Calcium: 9.2 mg/dL (ref 8.9–10.3)
Chloride: 99 mmol/L (ref 98–111)
Creatinine, Ser: 0.62 mg/dL (ref 0.44–1.00)
GFR, Estimated: >60 mL/min (ref >60)
Glucose, Bld: 89 mg/dL (ref 70–99)
Potassium: 3.2 mmol/L — ABNORMAL LOW (ref 3.5–5.1)
Sodium: 139 mmol/L (ref 135–145)
Total Bilirubin: 1.2 mg/dL — ABNORMAL HIGH (ref <1.2)
Total Protein: 7.8 g/dL (ref 6.5–8.1)

## 2023-07-31 LAB — CBC
HCT: 42.5 % (ref 36.0–46.0)
Hemoglobin: 14.8 g/dL (ref 12.0–15.0)
MCH: 31.6 pg (ref 26.0–34.0)
MCHC: 34.8 g/dL (ref 30.0–36.0)
MCV: 90.8 fL (ref 80.0–100.0)
Platelets: 243 10*3/uL (ref 150–400)
RBC: 4.68 MIL/uL (ref 3.87–5.11)
RDW: 12 % (ref 11.5–15.5)
WBC: 4.7 10*3/uL (ref 4.0–10.5)
nRBC: 0 % (ref 0.0–0.2)

## 2023-07-31 LAB — RESP PANEL BY RT-PCR (RSV, FLU A&B, COVID)  RVPGX2
Influenza A by PCR: NEGATIVE
Influenza B by PCR: NEGATIVE
Resp Syncytial Virus by PCR: NEGATIVE
SARS Coronavirus 2 by RT PCR: NEGATIVE

## 2023-07-31 LAB — TROPONIN I (HIGH SENSITIVITY): Troponin I (High Sensitivity): 7 ng/L (ref <18)

## 2023-07-31 LAB — POC URINE PREG, ED: Preg Test, Ur: NEGATIVE

## 2023-07-31 MED ORDER — SODIUM CHLORIDE 0.9 % IV BOLUS
1000.0000 mL | Freq: Once | INTRAVENOUS | Status: AC
Start: 2023-07-31 — End: 2023-07-31
  Administered 2023-07-31: 1000 mL via INTRAVENOUS

## 2023-07-31 MED ORDER — METOCLOPRAMIDE HCL 5 MG/ML IJ SOLN
10.0000 mg | Freq: Once | INTRAMUSCULAR | Status: AC
  Administered 2023-07-31: 10 mg via INTRAVENOUS
  Filled 2023-07-31: qty 2

## 2023-07-31 MED ORDER — ACETAMINOPHEN 500 MG PO TABS
1000.0000 mg | ORAL_TABLET | Freq: Once | ORAL | Status: AC
Start: 2023-07-31 — End: 2023-07-31
  Administered 2023-07-31: 1000 mg via ORAL
  Filled 2023-07-31: qty 2

## 2023-07-31 MED ORDER — ONDANSETRON 4 MG PO TBDP: 4.0000 mg | ORAL_TABLET | Freq: Three times a day (TID) | ORAL | 0 refills | Status: AC | PRN

## 2023-07-31 MED ORDER — KETOROLAC TROMETHAMINE 30 MG/ML IJ SOLN
15.0000 mg | Freq: Once | INTRAMUSCULAR | Status: AC
  Administered 2023-07-31: 15 mg via INTRAVENOUS
  Filled 2023-07-31: qty 1

## 2023-07-31 MED ORDER — DIPHENHYDRAMINE HCL 50 MG/ML IJ SOLN
50.0000 mg | Freq: Once | INTRAMUSCULAR | Status: AC
  Administered 2023-07-31: 50 mg via INTRAVENOUS
  Filled 2023-07-31: qty 1

## 2023-07-31 NOTE — Discharge Instructions (Addendum)
As we discussed please use your Zofran as needed for nausea as prescribed.  Please drink plenty of fluids and obtain plenty rest.  Follow-up with your doctor for further evaluation.  Return to the emergency department for any symptom personally concerning to yourself.

## 2023-07-31 NOTE — ED Triage Notes (Signed)
Pt here with syncope and cp x3 days. Pt states she has been passing out over the past couple of days and she has a severe headache. Pt also has endorses NVD.

## 2023-07-31 NOTE — ED Notes (Addendum)
First nurse note: pt from home via [facility] c/o "not feeling right" pt endorsed cp and shob. Pt 100% room air, was placed on 2L [place] for comfort. EMS reports etoh on board and hx of recreational drug use. EMS states pt was very fidgety  175/100 110 18 RR

## 2023-07-31 NOTE — ED Provider Notes (Signed)
[facility] Provider Note    Event Date/Time   First MD Initiated Contact with Patient 07/31/23 [id number]     (approximate)  History   Chief Complaint: Loss of Consciousness and Chest Pain  HPI  Paige Pierce is a 37 y.o. female presents to the emergency department for headache weakness nausea vomiting diarrhea.  According to the patient over the past 4 days or so she has been experiencing fatigue and weakness along with nausea vomiting and diarrhea and headache.  She states 3 days ago she got up and had a syncopal versus near syncopal episode and then again overnight she tried to go to the bathroom and had a brief syncopal versus near syncopal episode.  Patient denies any similar symptoms in the past.  Denies any fever.  Patient states she has been coughing with some mild congestion.  Denies any abdominal pain.  Does state some slight chest pressure but denies any chest pain.  Physical Exam   Triage Vital Signs: ED Triage Vitals  Encounter Vitals Group     BP 07/31/23 0653 (!) 213/149     Systolic BP Percentile --      Diastolic BP Percentile --      Pulse Rate 07/31/23 0653 (!) 131     Resp 07/31/23 0653 (!) 21     Temp 07/31/23 0653 98.3 F (36.8 C)     Temp Source 07/31/23 0653 Oral     SpO2 07/31/23 0653 98 %     Weight 07/31/23 0705 119 lb 14.9 oz (54.4 kg)     Height 07/31/23 0705 5\' 4"  (1.626 m)     Head Circumference --      Peak Flow --      Pain Score 07/31/23 0705 10     Pain Loc --      Pain Education --      Exclude from Growth Chart --     Most recent vital signs: Vitals:   07/31/23 0653 07/31/23 0705  BP: (!) 213/149 (!) 149/125  Pulse: (!) 131   Resp: (!) 21   Temp: 98.3 F (36.8 C)   SpO2: 98%     General: Awake, no distress.  CV:  Good peripheral perfusion.  Regular rate and rhythm around 100 bpm Resp:  Normal effort.  Equal breath sounds bilaterally.  Abd:  No distention.  Soft, nontender.  No rebound or guarding.   ED  Results / Procedures / Treatments   EKG  EKG viewed and interpreted by myself shows sinus tachycardia at 125 bpm with a narrow QRS, normal axis, normal intervals besides QTc prolongation.  Nonspecific ST changes.  RADIOLOGY  I have reviewed and interpreted CT head images.  No bleed seen my evaluation. Radiology is read the CT scan as negative for acute abnormality.   MEDICATIONS ORDERED IN ED: Medications  sodium chloride 0.9 % bolus 1,000 mL (has no administration in time range)  ketorolac (TORADOL) 30 MG/ML injection 15 mg (has no administration in time range)  metoCLOPramide (REGLAN) injection 10 mg (has no administration in time range)  diphenhydrAMINE (BENADRYL) injection 50 mg (has no administration in time range)  acetaminophen (TYLENOL) tablet 1,000 mg (1,000 mg Oral Given 07/31/23 0710)     IMPRESSION / MDM / ASSESSMENT AND PLAN / ED COURSE  I reviewed the triage vital signs and the nursing notes.  Patient's presentation is most consistent with acute presentation with potential threat to life or bodily function.  Patient presents to  the emergency department for headache weakness nausea vomiting diarrhea and syncope versus near syncope.  Overall patient appears well on physical exam she is tachycardic with somewhat dry appearing mucous membranes, we will begin IV hydration for possible dehydration.  We will check labs including cardiac enzymes.  Given the patient's headache and congestion we will obtain a COVID/flu swab.  Given the patient's headache we will dose fluids, Toradol, Reglan, Benadryl.  Will check a urine sample as well as a pregnancy test and continue to closely monitor while awaiting results.  Patient's workup is reassuring CT scan of the head is normal, urinalysis is clean with no urinary tract infection pregnancy test negative, COVID/flu/RSV is negative, reassuring CBC chemistry and negative troponin.  Patient states she is feeling much better after headache  medications.  Discussed with the patient continued nausea medication at home plenty fluids rest and PCP follow-up.  FINAL CLINICAL IMPRESSION(S) / ED DIAGNOSES   Near syncope Headache   Note:  This document was prepared using Dragon voice recognition software and may include unintentional dictation errors.   Minna Antis, MD 07/31/23 1339
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
## 2024-04-09 ENCOUNTER — Other Ambulatory Visit: Payer: Self-pay

## 2024-04-09 ENCOUNTER — Emergency Department
Admission: EM | Admit: 2024-04-09 | Discharge: 2024-04-10 | Disposition: A | Discharge status: Home / Self Care | Attending: Emergency Medicine | Admitting: Emergency Medicine

## 2024-04-09 ENCOUNTER — Encounter: Payer: Self-pay | Admitting: Emergency Medicine

## 2024-04-09 ENCOUNTER — Emergency Department

## 2024-04-09 DIAGNOSIS — S8991XA Unspecified injury of right lower leg, initial encounter: Secondary | ICD-10-CM | POA: Diagnosis not present

## 2024-04-09 DIAGNOSIS — S6992XA Unspecified injury of left wrist, hand and finger(s), initial encounter: Secondary | ICD-10-CM | POA: Insufficient documentation

## 2024-04-09 DIAGNOSIS — Y92410 Unspecified street and highway as the place of occurrence of the external cause: Secondary | ICD-10-CM | POA: Diagnosis not present

## 2024-04-09 MED ORDER — ACETAMINOPHEN 325 MG PO TABS
975.0000 mg | ORAL_TABLET | Freq: Once | ORAL | Status: AC
  Administered 2024-04-09: 975 mg via ORAL
  Filled 2024-04-09: qty 3
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
## 2024-04-10 DIAGNOSIS — S6992XA Unspecified injury of left wrist, hand and finger(s), initial encounter: Secondary | ICD-10-CM | POA: Diagnosis not present

## 2024-04-10 MED ORDER — OXYCODONE HCL 5 MG PO TABS
5.0000 mg | ORAL_TABLET | Freq: Once | ORAL | Status: AC
  Administered 2024-04-10: 5 mg via ORAL
  Filled 2024-04-10: qty 1

## 2024-04-10 MED ORDER — METHOCARBAMOL 500 MG PO TABS: 500.0000 mg | ORAL_TABLET | Freq: Three times a day (TID) | ORAL | 0 refills | Status: AC | PRN

## 2024-04-10 MED ORDER — IBUPROFEN 600 MG PO TABS
600.0000 mg | ORAL_TABLET | Freq: Once | ORAL | Status: AC
Start: 2024-04-10 — End: 2024-04-10
  Administered 2024-04-10: 600 mg via ORAL
  Filled 2024-04-10: qty 1

## 2024-04-10 MED ORDER — OXYCODONE HCL 5 MG PO TABS: 5.0000 mg | ORAL_TABLET | Freq: Three times a day (TID) | ORAL | 0 refills | Status: AC | PRN

## 2024-04-10 NOTE — Discharge Instructions (Addendum)
 Take acetaminophen  650 mg and ibuprofen  400 mg every 6 hours for pain.  Take with food. Take medications as prescribed for muscle relaxation and oxycodone  for severe pain only.  Thank you for choosing us  for your health care today!  Please see your primary doctor this week for a follow up appointment.   If you have any new, worsening, or unexpected symptoms call your doctor right away or come back to the emergency department for reevaluation.  It was my pleasure to care for you today.   Ginnie EDISON Cyrena, MD

## 2024-04-10 NOTE — ED Notes (Signed)
 Cyrena, MD notified of patient's need for pain medicine.
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
## 2024-08-09 ENCOUNTER — Other Ambulatory Visit: Payer: Self-pay
